# Patient Record
Sex: Female | Born: 1960 | Race: White | Hispanic: No | Smoking: Never smoker
Health system: Southern US, Community
[De-identification: ages and names within clinical notes are randomized; demographics above are authoritative.]

## PROBLEM LIST (undated history)

## (undated) DIAGNOSIS — N891 Moderate vaginal dysplasia: Secondary | ICD-10-CM

## (undated) DIAGNOSIS — F419 Anxiety disorder, unspecified: Secondary | ICD-10-CM

## (undated) DIAGNOSIS — G4700 Insomnia, unspecified: Secondary | ICD-10-CM

## (undated) DIAGNOSIS — N890 Mild vaginal dysplasia: Secondary | ICD-10-CM

## (undated) DIAGNOSIS — M722 Plantar fascial fibromatosis: Secondary | ICD-10-CM

## (undated) DIAGNOSIS — Z9989 Dependence on other enabling machines and devices: Secondary | ICD-10-CM

## (undated) DIAGNOSIS — F3281 Premenstrual dysphoric disorder: Secondary | ICD-10-CM

## (undated) DIAGNOSIS — R200 Anesthesia of skin: Secondary | ICD-10-CM

## (undated) DIAGNOSIS — H269 Unspecified cataract: Secondary | ICD-10-CM

## (undated) DIAGNOSIS — IMO0002 Reserved for concepts with insufficient information to code with codable children: Secondary | ICD-10-CM

## (undated) DIAGNOSIS — K219 Gastro-esophageal reflux disease without esophagitis: Secondary | ICD-10-CM

## (undated) DIAGNOSIS — G4733 Obstructive sleep apnea (adult) (pediatric): Secondary | ICD-10-CM

## (undated) DIAGNOSIS — Z9889 Other specified postprocedural states: Secondary | ICD-10-CM

## (undated) DIAGNOSIS — Z5189 Encounter for other specified aftercare: Secondary | ICD-10-CM

## (undated) DIAGNOSIS — G629 Polyneuropathy, unspecified: Secondary | ICD-10-CM

## (undated) DIAGNOSIS — T7840XA Allergy, unspecified, initial encounter: Secondary | ICD-10-CM

## (undated) DIAGNOSIS — I10 Essential (primary) hypertension: Secondary | ICD-10-CM

## (undated) DIAGNOSIS — C539 Malignant neoplasm of cervix uteri, unspecified: Secondary | ICD-10-CM

## (undated) HISTORY — DX: Mild vaginal dysplasia: N89.0

## (undated) HISTORY — PX: CERVICAL BIOPSY  W/ LOOP ELECTRODE EXCISION: SUR135

## (undated) HISTORY — PX: CATARACT EXTRACTION W/ INTRAOCULAR LENS  IMPLANT, BILATERAL: SHX1307

## (undated) HISTORY — PX: EYE SURGERY: SHX253

## (undated) HISTORY — DX: Insomnia, unspecified: G47.00

## (undated) HISTORY — DX: Other specified postprocedural states: Z98.890

## (undated) HISTORY — DX: Premenstrual dysphoric disorder: F32.81

## (undated) HISTORY — DX: Plantar fascial fibromatosis: M72.2

## (undated) HISTORY — DX: Unspecified cataract: H26.9

## (undated) HISTORY — DX: Encounter for other specified aftercare: Z51.89

## (undated) HISTORY — DX: Reserved for concepts with insufficient information to code with codable children: IMO0002

## (undated) HISTORY — DX: Moderate vaginal dysplasia: N89.1

## (undated) HISTORY — PX: SPINE SURGERY: SHX786

## (undated) HISTORY — DX: Anxiety disorder, unspecified: F41.9

## (undated) HISTORY — DX: Allergy, unspecified, initial encounter: T78.40XA

## (undated) HISTORY — DX: Essential (primary) hypertension: I10

---
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
## 1982-07-08 HISTORY — PX: OTHER SURGICAL HISTORY: SHX169

## 1986-07-08 HISTORY — PX: OTHER SURGICAL HISTORY: SHX169

## 1997-07-08 HISTORY — PX: LUMBAR DISC SURGERY: SHX700

## 1998-01-02 ENCOUNTER — Other Ambulatory Visit: Admission: RE | Admit: 1998-01-02 | Discharge: 1998-01-02 | Payer: Self-pay | Admitting: Gynecology

## 1998-06-22 ENCOUNTER — Observation Stay (HOSPITAL_COMMUNITY): Admission: RE | Admit: 1998-06-22 | Discharge: 1998-06-23 | Payer: Self-pay | Admitting: Neurosurgery

## 1998-06-22 ENCOUNTER — Encounter: Payer: Self-pay | Admitting: Neurosurgery

## 1999-02-22 ENCOUNTER — Other Ambulatory Visit: Admission: RE | Admit: 1999-02-22 | Discharge: 1999-02-22 | Payer: Self-pay | Admitting: Gynecology

## 2000-07-15 ENCOUNTER — Encounter
Admission: RE | Admit: 2000-07-15 | Discharge: 2000-07-15 | Payer: Self-pay | Admitting: Physical Medicine and Rehabilitation

## 2000-07-15 ENCOUNTER — Encounter: Payer: Self-pay | Admitting: Physical Medicine and Rehabilitation

## 2000-08-07 ENCOUNTER — Other Ambulatory Visit: Admission: RE | Admit: 2000-08-07 | Discharge: 2000-08-07 | Payer: Self-pay | Admitting: Gynecology

## 2002-04-12 ENCOUNTER — Other Ambulatory Visit: Admission: RE | Admit: 2002-04-12 | Discharge: 2002-04-12 | Payer: Self-pay | Admitting: Gynecology

## 2003-07-09 HISTORY — PX: VAGINAL HYSTERECTOMY: SUR661

## 2003-07-09 HISTORY — PX: HYSTEROSCOPY WITH D & C: SHX1775

## 2003-07-14 ENCOUNTER — Other Ambulatory Visit: Admission: RE | Admit: 2003-07-14 | Discharge: 2003-07-14 | Payer: Self-pay | Admitting: Gynecology

## 2003-07-26 ENCOUNTER — Encounter (INDEPENDENT_AMBULATORY_CARE_PROVIDER_SITE_OTHER): Payer: Self-pay | Admitting: Specialist

## 2003-07-26 ENCOUNTER — Ambulatory Visit (HOSPITAL_COMMUNITY): Admission: RE | Admit: 2003-07-26 | Discharge: 2003-07-26 | Payer: Self-pay | Admitting: Gynecology

## 2003-07-26 ENCOUNTER — Ambulatory Visit (HOSPITAL_BASED_OUTPATIENT_CLINIC_OR_DEPARTMENT_OTHER): Admission: RE | Admit: 2003-07-26 | Discharge: 2003-07-26 | Payer: Self-pay | Admitting: Gynecology

## 2004-05-24 ENCOUNTER — Encounter (INDEPENDENT_AMBULATORY_CARE_PROVIDER_SITE_OTHER): Payer: Self-pay | Admitting: *Deleted

## 2004-05-25 ENCOUNTER — Inpatient Hospital Stay (HOSPITAL_COMMUNITY): Admission: RE | Admit: 2004-05-25 | Discharge: 2004-05-26 | Payer: Self-pay | Admitting: Gynecology

## 2005-06-26 ENCOUNTER — Other Ambulatory Visit: Admission: RE | Admit: 2005-06-26 | Discharge: 2005-06-26 | Payer: Self-pay | Admitting: Gynecology

## 2007-04-07 ENCOUNTER — Ambulatory Visit: Payer: Self-pay | Admitting: Cardiology

## 2007-04-08 ENCOUNTER — Ambulatory Visit: Payer: Self-pay

## 2007-04-08 ENCOUNTER — Encounter: Payer: Self-pay | Admitting: Cardiology

## 2007-12-08 ENCOUNTER — Other Ambulatory Visit: Admission: RE | Admit: 2007-12-08 | Discharge: 2007-12-08 | Payer: Self-pay | Admitting: Gynecology

## 2008-07-08 HISTORY — PX: GANGLION CYST EXCISION: SHX1691

## 2008-07-27 ENCOUNTER — Ambulatory Visit (HOSPITAL_BASED_OUTPATIENT_CLINIC_OR_DEPARTMENT_OTHER): Admission: RE | Admit: 2008-07-27 | Discharge: 2008-07-27 | Payer: Self-pay | Admitting: Orthopedic Surgery

## 2008-07-27 ENCOUNTER — Encounter (INDEPENDENT_AMBULATORY_CARE_PROVIDER_SITE_OTHER): Payer: Self-pay | Admitting: Orthopedic Surgery

## 2008-11-05 HISTORY — PX: KNEE ARTHROSCOPY W/ MENISCECTOMY: SHX1879

## 2008-11-28 ENCOUNTER — Ambulatory Visit (HOSPITAL_BASED_OUTPATIENT_CLINIC_OR_DEPARTMENT_OTHER): Admission: RE | Admit: 2008-11-28 | Discharge: 2008-11-28 | Payer: Self-pay | Admitting: Orthopedic Surgery

## 2009-01-20 ENCOUNTER — Other Ambulatory Visit: Admission: RE | Admit: 2009-01-20 | Discharge: 2009-01-20 | Payer: Self-pay | Admitting: Gynecology

## 2009-01-20 ENCOUNTER — Ambulatory Visit: Payer: Self-pay | Admitting: Gynecology

## 2009-01-20 ENCOUNTER — Encounter: Payer: Self-pay | Admitting: Gynecology

## 2009-01-27 ENCOUNTER — Ambulatory Visit: Payer: Self-pay | Admitting: Gynecology

## 2009-05-01 ENCOUNTER — Ambulatory Visit (HOSPITAL_COMMUNITY): Admission: RE | Admit: 2009-05-01 | Discharge: 2009-05-01 | Payer: Self-pay | Admitting: Family Medicine

## 2010-03-15 ENCOUNTER — Other Ambulatory Visit: Admission: RE | Admit: 2010-03-15 | Discharge: 2010-03-15 | Payer: Self-pay | Admitting: Gynecology

## 2010-03-15 ENCOUNTER — Ambulatory Visit: Payer: Self-pay | Admitting: Gynecology

## 2010-04-16 ENCOUNTER — Ambulatory Visit: Payer: Self-pay | Admitting: Gynecology

## 2010-04-16 DIAGNOSIS — N890 Mild vaginal dysplasia: Secondary | ICD-10-CM

## 2010-04-16 HISTORY — DX: Mild vaginal dysplasia: N89.0

## 2010-04-24 ENCOUNTER — Ambulatory Visit: Payer: Self-pay | Admitting: Gynecology

## 2010-04-26 ENCOUNTER — Ambulatory Visit (HOSPITAL_BASED_OUTPATIENT_CLINIC_OR_DEPARTMENT_OTHER): Admission: RE | Admit: 2010-04-26 | Discharge: 2010-04-26 | Payer: Self-pay | Admitting: Gynecology

## 2010-04-26 ENCOUNTER — Ambulatory Visit: Payer: Self-pay | Admitting: Gynecology

## 2010-04-27 HISTORY — PX: CO2 LASER OF LEUKOPLAKIA: SHX1364

## 2010-05-08 ENCOUNTER — Ambulatory Visit: Payer: Self-pay | Admitting: Gynecology

## 2010-06-05 ENCOUNTER — Ambulatory Visit: Payer: Self-pay | Admitting: Gynecology

## 2010-07-29 ENCOUNTER — Encounter: Payer: Self-pay | Admitting: Family Medicine

## 2010-10-16 LAB — POCT I-STAT, CHEM 8
BUN: 14 mg/dL (ref 6–23)
Calcium, Ion: 1.15 mmol/L (ref 1.12–1.32)
Chloride: 102 mEq/L (ref 96–112)
Creatinine, Ser: 0.7 mg/dL (ref 0.4–1.2)
Glucose, Bld: 87 mg/dL (ref 70–99)
HCT: 43 % (ref 36.0–46.0)
Hemoglobin: 14.6 g/dL (ref 12.0–15.0)
Potassium: 3.8 mEq/L (ref 3.5–5.1)
Sodium: 138 mEq/L (ref 135–145)
TCO2: 28 mmol/L (ref 0–100)

## 2010-10-22 LAB — BASIC METABOLIC PANEL
CO2: 27 mEq/L (ref 19–32)
Glucose, Bld: 108 mg/dL — ABNORMAL HIGH (ref 70–99)
Potassium: 4.1 mEq/L (ref 3.5–5.1)
Sodium: 138 mEq/L (ref 135–145)

## 2010-11-20 NOTE — Op Note (Signed)
[name], [name]            ACCOUNT NO.:  [id number]   MEDICAL RECORD NO.:  [id number]          PATIENT TYPE:  AMB   LOCATION:  DSC                          FACILITY:  [facility]   PHYSICIAN:  Robert A. Thurston Hole, M.D. DATE OF BIRTH:  [birth year]   DATE OF PROCEDURE:  11/28/2008  DATE OF DISCHARGE:                               OPERATIVE REPORT   PREOPERATIVE DIAGNOSES:  1. Right knee lateral meniscus tear.  2. Right knee patellofemoral chondromalacia.  3. Right knee lateral patellar subluxation with synovitis.   POSTOPERATIVE DIAGNOSES:  1. Right knee lateral meniscus tear.  2. Right knee patellofemoral chondromalacia.  3. Right knee lateral patellar subluxation with synovitis.   PROCEDURES:  1. Right knee examination under anesthesia followed by arthroscopic      partial lateral meniscectomy.  2. Right knee patellofemoral chondroplasty.  3. Right knee lateral retinacular release.  4. Right knee partial synovectomy.   SURGEON:  Elana Alm. Thurston Hole, MD   ASSISTANT:  Julien Girt, PA   ANESTHESIA:  General.   OPERATIVE TIME:  30 minutes.   COMPLICATIONS:  None.   INDICATION FOR PROCEDURE:  [name] is a 48-year woman who has had  significant right knee pain increasing in nature over the past 8-12  months with exam and MRI documenting chondromalacia, synovitis with mild  patellofemoral subluxation.  She has failed conservative care and was  now to undergo arthroscopy.   DESCRIPTION:  [name] was brought to the operating room on Nov 28, 2008, after a knee block was placed in the holding area by Anesthesia.  She was placed on operative table in supine position.  Her right knee  was examined under anesthesia.  She had a full range of motion, knee was  stable on ligamentous exam with slight lateral patellar tracking.  The  right leg was prepped using sterile DuraPrep and draped using sterile  technique.  Originally, through an anterolateral portal, the  arthroscope  with a pump attached was placed and through an anteromedial portal, an  arthroscopic probe was placed.  On initial inspection of the medial  compartment, the articular cartilage showed grade 1 and 2  chondromalacia.  Medial meniscus was intact.  Intercondylar notch was  inspected.  The anterior and posterior cruciate ligaments were normal.  Lateral compartment was inspected.  The articular cartilage, she had a  20% grade 3 chondromalacia, which was debrided, otherwise grade 1 and 2  changes.  Lateral meniscus, she had a partial tear of 20% posterolateral  corner which was resected back to stable rim.  Patellofemoral joint, she  had a 30% grade 3 chondromalacia on the patella, which was debrided.  A  30% grade 3 chondromalacia of the medial femoral condyle contacting the  medial patellar facet at 40-50 degrees of flexion.  This was debrided as  well.  Slight lateral patellar tracking was noted as well.  Moderate  synovitis of medial and lateral gutters were debrided.  A lateral  retinacular release was carried out using an ArthroCare wand with no  excessive bleeding.  This significantly decompressed the patellofemoral  joint and improved patellar tracking,  normal.  At this point, it was  felt that all pathology have been satisfactorily addressed.  The  instruments were removed.  The portals were closed with 3-0 nylon  suture.  Sterile dressings were applied and the patient awakened and  taken to the recovery room in stable condition.   FOLLOWUP CARE:  [name] will be followed as an outpatient on  Vicodin and Mobic.  She will be seen back in the office in a week for  sutures out and followup.      Robert A. Thurston Hole, M.D.  Electronically Signed     RAW/MEDQ  D:  11/28/2008  T:  11/28/2008  Job:  [id number]

## 2010-11-20 NOTE — Op Note (Signed)
[name], [name]            ACCOUNT NO.:  [id number]   MEDICAL RECORD NO.:  [id number]          PATIENT TYPE:  AMB   LOCATION:  DSC                          FACILITY:  [facility]   PHYSICIAN:  Marlowe Kays, M.D.  DATE OF BIRTH:  December 27, 1960   DATE OF PROCEDURE:  07/27/2008  DATE OF DISCHARGE:                               OPERATIVE REPORT   PREOPERATIVE DIAGNOSIS:  Mass, inner aspect, left ankle.   POSTOPERATIVE DIAGNOSIS:  Mass, inner aspect,  left ankle.   OPERATION:  Excision of mass, inner aspect, left ankle.   SURGEON:  Marlowe Kays, MD   ASSISTANT:  Druscilla Brownie. Idolina Primer, PA-C   ANESTHESIA:  General.   PATHOLOGY AND INDICATIONS FOR PROCEDURE:  She has had a progressively  growing mass in the inner aspect of her left ankle.  She does not know  when it first began.  X-rays have been unremarkable and MRI demonstrated  most certainly a benign tumor, but was unable to be definitely  characterized as to exactly what it was - possibly a synovial or  ganglion cyst, and possibly a hemangiomatous vascular structures going  into it.   PROCEDURE:  Satisfied general anesthesia, pneumatic tourniquet to 350  mmHg, the leg was Esmarch out non-sterilely and the tourniquet inflated,  then prepped her left leg with DuraPrep from midcalf to toes and draped  as a sterile field, made a curved incision centered over the mass once  through the subcutaneous tissue with careful dissection and with Mr.  Idolina Primer assisting in the retraction because of the complexity of the  anatomy here.  I worked my way down around the mass, it was lying on and  superior to the posterior tibial nerve, I dissected it off with a blunt  hemostat.  Number of blood vessels entering into it were coagulated.  I  followed the mass down to the deltoid ligament and dissection there.  I  think there was a small aperture in the deltoid ligament area which I  closed with interrupted 3-0 Vicryl.  Several small bleeders  in this area  were coagulated as well.  I then released the tourniquet.  Minimal  bleeders were coagulated.  The wound was essentially dry on closure with  interrupted 3-0 Vicryl, subcutaneous tissue and interrupted 4-0 nylon  mattress sutures in the skin.  Betadine, Adaptic dry sterile dressing  were applied.  Because of the repair of the deltoid ligament, I placed  her in a posterior plaster splint.  She tolerated the procedure well and  at the time of this dictation, and was on her way to the recovery room  in satisfactory condition with no known complications.           ______________________________  Marlowe Kays, M.D.     JA/MEDQ  D:  07/27/2008  T:  07/28/2008  Job:  [id number]

## 2010-11-20 NOTE — Assessment & Plan Note (Signed)
[facility]                            CARDIOLOGY OFFICE NOTE   [name], [name]                   MRN:          [id number]  DATE:04/07/2007                            DOB:          [birth year]    REFERRING PHYSICIAN:  Jonita Albee, M.D.   RE-DICTATION:   PRIMARY CARE PHYSICIAN:  Jonita Albee, M.D., at [facility] on [address]   REASON FOR REFERRAL:  Evaluation of chest pain.   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  Over the last week, she has developed substernal chest  pressure which has been mostly persistent since that time, although it  has varied in intensity.  She has also had some associated shortness of  breath and diaphoresis with exertion when she hurries through the  airport.  She does not notice any relationship of the pressure to  exertion, and she does not notice any relationship to change in movement  or breathing.  She has had no associated symptoms of palpitations.   PAST MEDICAL HISTORY:  1. Previous lumbar disc surgery.  2. Previous surgery for cervical cancer.  3. Multiple knee surgeries.  4. She has also had multiple minor operations.  5. Hypertension.  6. Hyperlipidemia.   CURRENT MEDICATIONS:  Celexa, alprazolam, Diovan/hydrochlorothiazide,  and Provigil.   FAMILY HISTORY:  Positive in that her mother had an increased heart rate  and is seen by Dr. Daleen Squibb.  Her father has had hypertension.  She has a  brother who has no known heart disease.   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  Her job is quite stressful.  She  has three daughters, and she has a husband who is a Teacher, early years/pre.  Her  daughters are in junior high and high school and  are quite good  students, and one or two of them play volleyball.   REVIEW OF SYSTEMS:  Positive for some symptoms of reflux.   PHYSICAL EXAMINATION:  VITAL SIGNS:  Blood pressure 134/98, pulse 83 and  regular.  NECK:  There is no venous distention.  The carotid pulses are full,  without bruits.  CHEST:  Clear.  There were no rales or rhonchi.  CARDIAC:  Rhythm was regular.  The first and second heart sounds were  normal.  I could seen no murmurs, gallops, or clicks.  ABDOMEN:  Soft, with normal bowel sounds.  There was no  hepatosplenomegaly.  EXTREMITIES:  The peripheral pulses were full.  There was no peripheral  edema.  MUSCULOSKELETAL:  Showed no deformities.  SKIN:  Warm and dry.  NEUROLOGIC:  Showed no focal neurological signs.   Electrocardiogram was normal.   IMPRESSION:  1. Chest pain of uncertain etiology.  2. Hypertension.  3. Hyperlipidemia.   RECOMMENDATIONS:  [name] chest pain is somewhat atypical for  ischemia in that it  is not related to exertion, but she does have a  significant risk profile with hypertension, hyperlipidemia, and excess  weight.  I think she should be evaluated for ischemic heart disease, and  I discussed various options with her, including a stress Myoview scan, a  CT angiogram, and a cardiac catheterization.  After some discussion, we  decided to proceed with a stress Myoview scan.  She realizes that there  is a possibility that this could give [place] a false reading because of her  weight.  If her stress Myoview scan is positive, then she may need  cardiac catheterization.  If the stress Myoview scan is negative, then I  think she could have a trial of PPI with followup with Dr. Perrin Maltese.  I  will be in touch with her by phone after we have the results.  We will  also arrange for her to have an echocardiogram to rule out structural  heart disease.   ADDENDUM:  Chest x-ray in Dr. Ernestene Mention office showed borderline heart  size and clear  lung fields.     Bruce Elvera Lennox Juanda Chance, MD, [facility]  Electronically Signed    BRB/MedQ  DD: 04/07/2007  DT: 04/08/2007  Job #: [id number]

## 2010-11-20 NOTE — Assessment & Plan Note (Signed)
[facility]                            CARDIOLOGY OFFICE NOTE   GUY, TONEY                   MRN:          [id number]  DATE:04/07/2007                            DOB:          [birth year]    REFERRING PHYSICIAN:  Jonita Albee, M.D., [facility] on [address].   PRIMARY CARE PHYSICIAN:  Jonita Albee, M.D.   REASON FOR REFERRAL:  Evaluation of chest pain.   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  Over the last week, she has developed substernal chest  pressure which has been persistent over this time period with increasing  and decreasing in severity.  She has had no associated palpitations or  change in shortness of breath.  She has not been able to identify any  features that aggravate this, specifically, it does not change with  position.  She does say that when she exerts herself which she does when  she is going to airports, for example, she does get sweaty and does get  somewhat short of breath, although this has not changed.   PAST MEDICAL HISTORY:  1. Hypertension.  2. Hyperlipidemia.  She said that her total cholesterol is 253, HDL 71      and LDL 130 and she has discussed with Dr. Perrin Maltese about treatment      and decided to try and treat this with diet alone.  3. Previous knee surgeries.  4. Previous lumbar disk surgery.  5. Previous surgery for cervical cancer.  6. She has had multiple other minor operations.   CURRENT MEDICATIONS:  1. Celexa.  2. Alprazolam.  3. Diovan/hydrochlorothiazide.  4. Provigil.   FAMILY HISTORY:  Her mother has had elevated heart rate and sees Dr. Juanito Doom.  Her father has had hypertension.  There is no history of heart  attacks in the family.   SOCIAL HISTORY:  She is married and has three daughters.  Her daughters  are about 83, 21 and 33 and are quite  good students and volleyball  players.  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  She has quite a bit of stress  involved in her work.   REVIEW OF SYSTEMS:  Positive for intermittent symptoms of reflux.  She  has not taken PPIs but she has taken antacids.   PHYSICAL EXAMINATION:  VITAL SIGNS:  Blood pressure 134/98, pulse 83 and  regular.  NECK:  There was no venous distension.  The carotid pulses were full  without bruits.  CHEST:  Clear.  There were no rales or rhonchi.  CARDIOVASCULAR:  The cardiac rhythm was regular.  The first and second  heart sounds were normal.  No murmurs or gallops.  ABDOMEN:  Soft without hepatosplenomegaly.  EXTREMITIES:  Peripheral pulses were full.  There was no peripheral  edema.  MUSCULOSKELETAL:  No deformities.  SKIN:  Warm and dry.  NEUROLOGIC:  No focal neurological signs.   Electrocardiogram was normal.   IMPRESSION:  1. Chest pain of uncertain etiology.  2. Hypertension.  3. Hyperlipidemia.   RECOMMENDATIONS:  The patient's symptoms are somewhat atypical for  ischemia in that they have been not typically related to exertion.  She  does have a moderately high risk profile with hypertension,  hyperlipidemia and excess weight.  I think she needs to be evaluated  further for the possibility of ischemic heart disease and discussed  several possibilities with her including stress Myoview scan, CT angio  and cardiac catheterization.  The noninvasive tests have a limitation  that her weight may make interpretation somewhat difficult.  Despite  these limitations, we decided to go ahead and proceed with noninvasive  testing with an exercise rest stress Myoview scan.  Also get an  echocardiogram  to rule out any myopathic disease and valvular disease,  although I do not detect any valvular disease on examination.  I  will be  in touch with her by phone after we have the results.  If the scan is  negative, then I think a trial of PPIs with follow-up with Dr. Perrin Maltese  would be indicated.  The other alternative is that this still could be  musculoskeletal pain.  Will be in touch with her after I have the  results of her tests.     Bruce Elvera Lennox Juanda Chance, MD, [facility]  Electronically Signed    BRB/MedQ  DD: 04/07/2007  DT: 04/08/2007  Job #: [id number]

## 2010-11-23 NOTE — Discharge Summary (Signed)
[name], [name]            ACCOUNT NO.:  [id number]   MEDICAL RECORD NO.:  [id number]          PATIENT TYPE:  INP   LOCATION:  9303                          FACILITY:  [facility]   PHYSICIAN:  Juan H. Lily Peer, M.D.DATE OF BIRTH:  11/15/1960   DATE OF ADMISSION:  05/24/2004  DATE OF DISCHARGE:  05/26/2004                                 DISCHARGE SUMMARY   HISTORY:  The patient is a 50 year old gravida 3, para 3 who on the morning  of May 24, 2004 underwent a transvaginal hysterectomy, secondary to  menometrorrhagia.  She had 200 cc blood loss during the procedure, and had  2100 cc of lactated Ringer's and received 1 g of Cefotan for prophylaxis.   HOSPITAL COURSE:  On postoperative day #1 she was doing well.  Her  hemoglobin was 12.1, hematocrit 34.9%, platelet count 309,000.  She was  afebrile.  Her Foley catheter was removed.  She was voiding well.  She was  starting to pass flatus later that evening.  She was started on Percocet and  had tolerated her liquid diet.  On the following day she was tolerating her  full regular diet.  She was afebrile and was discharged home on Percocet  7.5/500 mg one p.o. q.4-6h. p.r.n. pain.   DISCHARGE INSTRUCTIONS:  She was to follow up in three weeks for a  postoperative visit.   FINAL DIAGNOSIS:  Menometrorrhagia.   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  No __________ lesion.  Endometrium with markedly decidualized endometrial  stroma, with inactive endometrial plants -- suggestive of exogenous  progesterone therapy.  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   FINAL DISPOSITION AND FOLLOWUP:  The patient was discharged home on her  second postoperative day, ambulating and voiding well.  Tolerating regular  diet well.   DISCHARGE INSTRUCTIONS:  No lifting.  No strenuous activity.   DISCHARGE MEDICATIONS:  Prescription for Percocet 7.5/500 mg to take one  p.o. q.4-6h. p.r.n. pain.   FOLLOWUP:  GYN office at [facility] with Dr. Lily Peer in three  weeks.     Juan   JHF/MEDQ  D:  06/18/2004  T:  06/19/2004  Job:  [id number]

## 2010-11-23 NOTE — Op Note (Signed)
NAMETEMPIE, GIBEAULT            ACCOUNT NO.:  [id number]   MEDICAL RECORD NO.:  [id number]          PATIENT TYPE:  OBV   LOCATION:  9399                          FACILITY:  [facility]   PHYSICIAN:  Juan H. Lily Peer, M.D.DATE OF BIRTH:  [birth year]   DATE OF PROCEDURE:  05/24/2004  DATE OF DISCHARGE:                                 OPERATIVE REPORT   INDICATIONS FOR PROCEDURE:  A 50 year old, gravida 3, para 3 with chronic  history of menometrorrhagia with several endometrial biopsies in the past  and sonohysterogram. The patient had been on different hormone replacement  therapies and eventually was placed on Megace to stop her bleeding to get  her iron level up prior to her surgery.  Her preoperative hemoglobin and  hematocrit of 14.6 and 42.3 respectively with platelets 372,000.   PREOPERATIVE DIAGNOSES:  Menometrorrhagia.   POSTOPERATIVE DIAGNOSES:  Menometrorrhagia.   ANESTHESIA:  Spinal.   SURGEON:  Juan H. Lily Peer, M.D.   FIRST ASSISTANT:  Timothy P. Fontaine, M.D.   PROCEDURE:  Transvaginal hysterectomy.   FINDINGS:  Upper limits of normal uterus, boggy, very lush endometrium,  normal tubes and ovary.   DESCRIPTION OF PROCEDURE:  After the patient was adequately counseled, she  was taken to the operating room where she had undergone spinal anesthesia.  She was placed in high lithotomy position, the abdomen, vagina and perineum  were prepped and draped in the usual sterile fashion. A Foley catheter was  placed in an effort to monitor urinary output.  A short weighted bill  speculum was placed in the posterior vaginal vault, the anterior and  posterior cervical lip were respectively grasped with a __________ clamp.  The cervicovaginal fold was identified and a circumferential incision was  made and the fascia was separated from the mucosa in a circumferential  fashion. The cul-de-sac was entered and a long weighted bill speculum was  introduced into the posterior  cul-de-sac with the use of the gyrus forceps  electrical unit. The uterosacral ligaments were coagulated and cut and  suture ligated with #0 Vicryl suture and tagged. The remainder of the broad  cardinal ligaments were done in a similar fashion bilaterally and both  triple pedicles were identified and they were cross clamped with a Heaney  clamp, cut and individually tied with #0 Vicryl suture followed by  transfixation stitch of #0 Vicryl suture. The ovaries were inspected,  appeared normal size and shape and the vaginal vault was irrigated with  normal saline solution.  Some oozing that was noted at the vaginal cuff was  secured with a running locking stitch of #0 Vicryl suture in a baseball  fashion.  A McCall culdoplasty was performed using 2-0 Vicryl suture from  the posterior cul-de-sac __________ the peritoneum and taking a bite out of  both uterosacral ligaments and secured at the 6 o'clock position.  The  vaginal vault was then closed with interrupted sutures of #0 Vicryl suture.  The McCall stitch was secured, the vaginal vault was copiously  irrigated with normal saline solution. The vagina was packed with an  iodoform gauze. The patient was transferred to  the recovery room with stable  vital signs.  Blood loss was 200 mL, IV fluid was 2100 mL of lactated  Ringer's.  Urine output 200 mL and clear. She received a gram of Cefotan  prophylactically preop.     Juan   JHF/MEDQ  D:  05/24/2004  T:  05/25/2004  Job:  [id number]

## 2010-11-23 NOTE — H&P (Signed)
[name], [name]            ACCOUNT NO.:  [id number]   MEDICAL RECORD NO.:  [id number]           PATIENT TYPE:   LOCATION:                                 FACILITY:   PHYSICIAN:  Juan H. Lily Peer, M.D.     DATE OF BIRTH:   DATE OF ADMISSION:  05/24/2004  DATE OF DISCHARGE:                                HISTORY & PHYSICAL   CHIEF COMPLAINT:  Menometrorrhagia.   HISTORY:  The patient is a 50 year old, gravida 3, para 3 (normal  spontaneous vaginal delivery), who has had a long-standing history of  dysfunctional uterine bleeding.  The patient has had several endometrial  biopsies in the past.  She had been placed on Megace in the past, as well,  in an effort to control her bleeding.  She had been tried also on different  hormone regimens, and continued to have dysfunctional uterine bleeding.  When she was seen in the office on March 12, 2004, she had been bleeding  for almost a 3-week period consecutively.  She had an endometrial biopsy in  January of 2005, which was normal.  A repeat endometrial biopsy in September  of 2005 demonstrated a proliferative endometrium with breakdown order of  proliferative pattern.  No hyperplasia or malignancy noted.  The patient  wanted to proceed with definitive surgery such as a hysterectomy, and had to  go through an endometrial ablation since she is otherwise and concerned  about her risks for endometrial hyperplasia or endometrial carcinoma.  We  underwent a detailed discussion of the risks and benefits and pros and cons  of hysterectomy.  On examination, her uterus descended well.  There was no  evidence of cystocele, rectocele and will plan to examine under anesthesia  to determine if the vaginal route would be beneficial, versus proceeding  with an abdominal approach.   PAST MEDICAL HISTORY:  1.  The patient has had 3 normal spontaneous vaginal deliveries.  She had      anti-E antibodies prenatally.  2.  She suffers from  premenstrual dysphoric disorder.  3.  She suffers also from plantar fasciitis.  4.  She also has osteopenia, for which she is on Fosamax 70 mg daily,      calcium 1500 mg daily, along with 800 international units of vitamin D.      She takes vitamin E 400 units daily, as well, and Claritin D on a p.r.n.      basis.  5.  She has also had cataract surgery in the past.  6.  The patient also had a diagnostic hysteroscopy and endometrial curettage      in January of 2005, whereby that pathology report demonstrated polypoid      fragments of the _smooth_________ muscle, and no evidence of      hyperplasia.  Mammogram earlier this year looked normal.   FAMILY HISTORY:  Grandparents with diabetes.  Father with a history of  hypertension.  Grandmother with a history of colon cancer.  Grandmother with  breast cancer.  Mother with breast cancer history.   ALLERGIES:  CODEINE.   PHYSICAL EXAMINATION:  VITAL SIGNS:  The patient weighs 238 pounds.  Height  5 feet, and 5-1/4 inches.  Blood pressure 130/80.  HEENT:  Unremarkable.  NECK:  Supple.  Trachea midline.  No carotid bruits, no thyromegaly.  LUNGS:  Clear to auscultation without any rhonchi or wheezes.  HEART:  Regular rate and rhythm.  No murmurs or gallops.  BREAST EXAM:  The breast exam done at the time of her annual exam in January  year had been reportedly normal.  ABDOMEN:  Soft, nontender.  No rebound or guarding.  PELVIC:  Bartholin's, urethra, Skene's glands within normal limits.  Vaginal  and cervix revealed no gross lesions on inspection.  Uterus anteverted and  upper limits of normal.  Adnexa no masses or tenderness.  RECTAL:  Not done.   ASSESSMENT:  A 51 year old, gravida 3, para 3, with persistent of  menometrorrhagia, currently on Megace 20 mg b.i.d. to stop her bleeding.  The patient with several endometrial biopsies in the past.  D&C hysteroscopy  with continued symptoms.  She had been offered endometrial ablation, and   decided to proceed with definitive treatment, such as a hysterectomy.  The  risks, benefits, and pros and cons of hysterectomy were discussed.  Will  plan on a transvaginal approach.  Initially will examine under anesthesia.  If it does not appear that her uterus descends well because of her size, we  may need to proceed with an abdominal approach.  She will be counseled for  both procedures, in the event that while she is asleep we may need to go  through the abdominal route, or, if we cannot finish it in the vaginal route  and had to complete it abdominally.  We will make every effort to leave both  ovaries, unless surgically indicated or any unusual findings at the time of  the surgery.  Risks of the operation to include infection (although she will  receive prophylactic antibiotics) were discussed.  The risks for deep vein  thrombosis and pulmonary embolism were discussed also to include even death.  The patient will have PSA stockings for DVT prophylaxis.  In the even of  uncontrolled hemorrhage, she is fully aware that she may need a blood  transfusion, and that carries some risks, such as anaphylactic reaction,  hepatitis, and AIDS from donor blood and blood products.  The patient is  fully aware that she will not be able to have anymore children as a result  of this operation, and, in the remote possibility of both ovaries needing to  be removed, that she would need to be placed on hormone replacement therapy  for the remainder of her life.  We also discussed the potential risk of  complications from her surgery to include trauma to nearby structures, the  bladder, the rectum, requiring corrective surgery at that point.  All these  issues were discussed with the patient, all questions were answered, and  will follow accordingly.   PLAN:  The patient is scheduled for total vaginal hysterectomy, possible total abdominal hysterectomy, on Thursday, May 24, 2004 at 7:30 a.m. at   [facility].     [place]   JHF/MEDQ  D:  05/18/2004  T:  05/18/2004  Job:  [id number]

## 2010-11-23 NOTE — Op Note (Signed)
NAME:  [name], [name]                      ACCOUNT NO.:  [id number]   MEDICAL RECORD NO.:  [id number]                   PATIENT TYPE:  AMB   LOCATION:  [facility]                                 FACILITY:  [facility]   PHYSICIAN:  Juan H. Lily Peer, M.D.             DATE OF BIRTH:  Jul 28, 1960   DATE OF PROCEDURE:  07/26/2003  DATE OF DISCHARGE:                                 OPERATIVE REPORT   INDICATIONS FOR PROCEDURE:  The patient is a 50 year old Gravida III, Para 3  with a history of menometrorrhagia.   PREOPERATIVE DIAGNOSIS:  Menometrorrhagia.   POSTOPERATIVE DIAGNOSIS:  Menometrorrhagia.   SURGEON:  Juan H. Lily Peer, M.D.   ANESTHESIA:  General endotracheal anesthesia.   PROCEDURE:  Diagnostic hysteroscopy with endometrial biopsy/curettage.   FINDINGS:  Multiple intrauterine polyps were seen throughout the cavity.  Both tubal ostia were identified and the cervical canal was visualized and  smooth.   DESCRIPTION OF PROCEDURE:  After the patient was adequately counseled, she  was taken to the operating room where she underwent a general endotracheal  anesthesia.  She had been given a gram of Cefotan prophylactically.  She was  placed in the high lithotomy position and the perineum and vagina were  prepped and draped in the usual sterile fashion.  A red rubber Roxan Hockey was  inserted to empty the bladder contents although she had just recently voided  before being taken to the operating room.  Her examination under anesthesia  demonstrated an anteverted uterus with normal size, shape and consistency  without any palpable adnexal masses.  A previously placed Laminaria was  removed requiring minimal cervical dilatation.  The anterior cervical lip  was grasped with the single tooth tenaculum and the uterus sounded to  approximately 8 cm.  The Lanice Shirts operative resectoscope with the 90  degree wire loop was inserted into the intrauterine cavity.  The distending  medium was 3%  sorbitol.  The [facility] cervical generator was  set at 70 watts on the cutting mode and 100 on the coagulation mode.  Intrauterine pressure was kept at 90 to 100 mmHg.  Inspection of the entire  uterine cavity demonstrated multiple vascular polyps scattered throughout  the endometrial cavity.  Both tubal ostia were identified.  The endocervical  canal was smooth.  These polyps were independently resected and will be sent  for histological evaluation.  Following this a vigorous curettage with a  blunt curette was utilized followed by a suction curette with a 10 mm  suction curette to completely evacuate the contents.  Re-inspection of the  endometrial cavity demonstrated removal of all the polyps and good  hemostasis.  The tenaculum was removed.  The patient was extubated.  She was  given 30 mg of Toradol en route to the recovery room.  Vital signs were  stable.  Blood loss was minimal and fluid resuscitation consisted of a liter  of lactated Ringers.  Fluid deficit from the 3% sorbitol distending medium  was recorded as 85 mL.                                              Juan H. Lily Peer, M.D.   JHF/MEDQ  D:  07/26/2003  T:  07/26/2003  Job:  [id number]

## 2010-11-23 NOTE — H&P (Signed)
NAME:  [name], [name]                      ACCOUNT NO.:  [id number]   MEDICAL RECORD NO.:  [id number]                   PATIENT TYPE:  AMB   LOCATION:  [facility]                                 FACILITY:  [facility]   PHYSICIAN:  Juan H. Lily Peer, M.D.             DATE OF BIRTH:  04/07/1961   DATE OF ADMISSION:  DATE OF DISCHARGE:                                HISTORY & PHYSICAL   CHIEF COMPLAINT:  Menometrorrhagia.   HISTORY OF PRESENT ILLNESS:  The patient is a 50 year old gravida 3, para 3  with a history of menometrorrhagia who has been evaluated in the office.  She had an endometrial biopsy and sonohysterogram secondary to the  menometrorrhagia.  She had been placed on Megace 20 mg b.i.d. to shut off  her bleeding.  Her TSH and Prolactin have been normal.  Her endometrial  biopsy on May 06, 2003, demonstrated a disorder of proliferative pattern  with breakdown, no evidence of hyperplasia or malignancy.  The ultrasound  demonstrated a uterus measuring 9.8 x 5.0 x 6.2 cm with an endometrial  stripe of 15.6 mm.  The right and left ovaries were normal.  There was no  fluid in the cul-de-sac.  The saline infusion and sonohysterogram only  demonstrated endometrial thickness especially along the anterior and  posterior wall and slightly irregular.  The plan was to continue her Megace  20 mg b.i.d. and to schedule a hysteroscopic hysteroscopy and D&C.  Once  this was completed we had discussed that she would probably benefit from  placement on a low dose oral contraceptive pill, she is overweight and has a  history of chronic anovulation and provided a TSH and Prolactin was normal,  I am concerned that doing endometrial ablation with a chronic anovulation.  Later on she could develop underlying hyperplasia or some form of uterine  hyperplastic or atypicality.  She would like to proceed with a hysterectomy  as her last resort and her surgery originally was scheduled for last week  but  due to the fact that she had a sinusitis was rescheduled for tomorrow,  July 26, 2003.   ALLERGIES:  The patient is allergic to CODEINE.   PAST MEDICAL HISTORY:  She has had three normal spontaneous vaginal  deliveries.  She has had preeclampsia.  She has had anti-E.  She has had  PMDD and she has had plantar fasciitis.  She has had cataract surgery.   MEDICATIONS:  1. Vitamin E.  2. Cardizem D.   FAMILY HISTORY:  Significant for grandparents with diabetes, parents with  hypertension, grandmother with hypertension, breast cancer in the  grandmother, and mother with breast cancer.   PAST GYNECOLOGICAL HISTORY:  The patient's most recent Pap smear was in  January 2005 which was reported to be normal.   PHYSICAL EXAMINATION:  GENERAL:  The patient is 5 feet 5 inches tall, weight  238 pounds, blood pressure 130/80.  HEENT:  Unremarkable.  NECK:  Supple.  Trachea midline.  No carotid bruits.  No thyromegaly.  LUNGS:  Clear to auscultation.  No rhonchi or wheezes.  HEART:  Regular rate and rhythm; no murmurs or gallops.  BREASTS:  Exam was normal at the time of her annual exam in January of this  year.  ABDOMEN:  Soft, nontender, without rebound or guarding.  PELVIC:  Bartholin's, urethra, Skene glands within normal limits.  Vaginal  and cervix no gross lesions on inspection.  Uterus:  Anteverted, normal  size, shape and consistency.  Adnexa without masses or tenderness.  RECTAL:  Exam is unremarkable.   ASSESSMENT:  This is a 50 year old gravida 3, para 3 with menometrorrhagia,  recent endometrial biopsy benign.  The patient is on Megace 20 mg b.i.d.  The patient is scheduled to undergo hysteroscopy D&C on Tuesday, July 26, 2003.  She was seen in the office on Monday, July 25, 2003, for  preoperative consultation and a Laminaria was placed endocervically to  facilitate the insertion of the hysteroscope tomorrow in the operating room.  The patient had previously been  provided with literature and information on  hysteroscopy.  Its risks, benefits, pros and cons were discussed and include  perforation of the uterus from the instrumentation, hemorrhage requiring  blood transfusion with its potential risks of anaphylactic reaction,  hepatitis, and AIDS from blood transfusion, the risks of deep venous  thrombosis and pulmonary embolism, pulmonary edema from fluid overload from  the distending media in the event of perforation requiring emergency open  laparotomy.  All of these issues were discussed with the patient and all  questions were answered and we follow accordingly.   PLAN:  The patient is scheduled for diagnostic D&C and hysteroscopy  tomorrow, Tuesday, July 26, 2003, at 1 p.m. in [facility].                                               Juan H. Lily Peer, M.D.    JHF/MEDQ  D:  07/25/2003  T:  07/25/2003  Job:  [id number]

## 2011-01-30 HISTORY — PX: ABDOMINOPLASTY: SUR9

## 2011-04-22 DIAGNOSIS — D126 Benign neoplasm of colon, unspecified: Secondary | ICD-10-CM

## 2011-05-08 ENCOUNTER — Encounter: Payer: Self-pay | Admitting: Anesthesiology

## 2011-05-15 ENCOUNTER — Encounter: Payer: Self-pay | Admitting: Gynecology

## 2011-05-15 ENCOUNTER — Other Ambulatory Visit (HOSPITAL_COMMUNITY)
Admission: RE | Admit: 2011-05-15 | Discharge: 2011-05-15 | Disposition: A | Discharge status: Home / Self Care | Payer: 59 | Source: Ambulatory Visit | Attending: Gynecology | Admitting: Gynecology

## 2011-05-15 ENCOUNTER — Ambulatory Visit (INDEPENDENT_AMBULATORY_CARE_PROVIDER_SITE_OTHER): Payer: 59 | Admitting: Gynecology

## 2011-05-15 VITALS — BP 128/90 | Ht 64.75 in | Wt 221.0 lb

## 2011-05-15 DIAGNOSIS — Z01419 Encounter for gynecological examination (general) (routine) without abnormal findings: Secondary | ICD-10-CM

## 2011-05-15 DIAGNOSIS — R634 Abnormal weight loss: Secondary | ICD-10-CM

## 2011-05-15 DIAGNOSIS — N951 Menopausal and female climacteric states: Secondary | ICD-10-CM

## 2011-05-15 DIAGNOSIS — Z1211 Encounter for screening for malignant neoplasm of colon: Secondary | ICD-10-CM

## 2011-05-15 NOTE — Progress Notes (Signed)
[name] 06-29-1961 [id number]   History:    50 y.o.  for annual exam with no complaints today. Review records in her last mammogram was in October 2011. She recently had an abdominoplasty. Review of her record indicates she was weighing 234 down to 21. She has history of hypertension and has been followed by Dr.Guest. Patient with prior history transvaginal hysterectomy. Patient with very vague vasomotor symptoms. Review records indicated 2011 her FSH was at 3. Patient does her monthly self breast examination.  Past medical history,surgical history, family history and social history were all reviewed and documented in the EPIC chart.  Gynecologic History No LMP recorded. Patient has had a hysterectomy. Contraception: Prior hysterectomy Last Pap: 2011. Results were:normal} Last mammogram: 2011. Results were: normal  Obstetric History OB History    Grav Para Term Preterm Abortions TAB SAB Ect Mult Living   3 3 3       3      # Outc Date GA Lbr Len/2nd Wgt Sex Del Anes PTL Lv   1 TRM     F SVD  No Yes   2 TRM     F SVD   Yes   3 TRM     F SVD   Yes       ROS:  Was performed and pertinent positives and negatives are included in the history.  Exam: chaperone present  BP 128/90  Ht 5' 4.75" (1.645 m)  Wt 221 lb (100.245 kg)  BMI 37.06 kg/m2  Body mass index is 37.06 kg/(m^2).  General appearance : Well developed well nourished female. No acute distress HEENT: Neck supple, trachea midline, no carotid bruits, no thyroidmegaly Lungs: Clear to auscultation, no rhonchi or wheezes, or rib retractions  Heart: Regular rate and rhythm, no murmurs or gallops Breast:Examined in sitting and supine position were symmetrical in appearance, no palpable masses or tenderness,  no skin retraction, no nipple inversion, no nipple discharge, no skin discoloration, no axillary or supraclavicular lymphadenopathy Abdomen: no palpable masses or tenderness, no rebound or guarding scar from  previous abdominal last the noted in the lower aspect of her abdomen. Extremities: no edema or skin discoloration or tenderness  Pelvic:  Bartholin, Urethra, Skene Glands: Within normal limits             Vagina: No gross lesions or discharge  Cervix: Absent  Uterus absent,                                                    Adnexa  Without masses or tenderness  Anus and perineum  normal   Rectovaginal  normal sphincter tone without palpated masses or tenderness             Hemoccult done resulting in time of this dictation     Assessment/Plan:  50 y.o. female for annual exam unremarkable. Patient will be scheduling a mammogram which is overdue. She was instructed to continue monthly self breast examination. She was instructed to continue to take her calcium vitamin D for osteoporosis prevention. We'll draw her FSH and TSH today due to her vague vasomotor symptoms. We'll wait her symptoms get worse we tested down discussed treatment of the menopause. Patient will schedule her colonoscopy. Fecal occult blood testing result pending at time of this dictation. We'll plan on doing a bone density study  in the next year.   Ok Edwards MD, 5:59 PM 05/15/2011

## 2011-05-17 ENCOUNTER — Encounter: Payer: Self-pay | Admitting: Gynecology

## 2011-05-22 ENCOUNTER — Other Ambulatory Visit: Payer: Self-pay | Admitting: *Deleted

## 2011-05-22 DIAGNOSIS — B373 Candidiasis of vulva and vagina: Secondary | ICD-10-CM

## 2011-05-22 DIAGNOSIS — B3731 Acute candidiasis of vulva and vagina: Secondary | ICD-10-CM

## 2011-05-22 MED ORDER — FLUCONAZOLE 150 MG PO TABS: 150.0000 mg | ORAL_TABLET | Freq: Once | ORAL | Status: AC

## 2011-08-13 ENCOUNTER — Encounter: Payer: Self-pay | Admitting: Gynecology

## 2011-08-13 ENCOUNTER — Other Ambulatory Visit: Payer: Self-pay | Admitting: Gynecology

## 2011-08-13 ENCOUNTER — Ambulatory Visit (INDEPENDENT_AMBULATORY_CARE_PROVIDER_SITE_OTHER): Payer: 59 | Admitting: Gynecology

## 2011-08-13 VITALS — BP 136/92

## 2011-08-13 DIAGNOSIS — R8290 Unspecified abnormal findings in urine: Secondary | ICD-10-CM

## 2011-08-13 DIAGNOSIS — N760 Acute vaginitis: Secondary | ICD-10-CM

## 2011-08-13 DIAGNOSIS — Z113 Encounter for screening for infections with a predominantly sexual mode of transmission: Secondary | ICD-10-CM

## 2011-08-13 DIAGNOSIS — N9089 Other specified noninflammatory disorders of vulva and perineum: Secondary | ICD-10-CM

## 2011-08-13 DIAGNOSIS — A499 Bacterial infection, unspecified: Secondary | ICD-10-CM

## 2011-08-13 DIAGNOSIS — N390 Urinary tract infection, site not specified: Secondary | ICD-10-CM

## 2011-08-13 DIAGNOSIS — B9689 Other specified bacterial agents as the cause of diseases classified elsewhere: Secondary | ICD-10-CM

## 2011-08-13 DIAGNOSIS — R82998 Other abnormal findings in urine: Secondary | ICD-10-CM

## 2011-08-13 LAB — URINALYSIS W MICROSCOPIC + REFLEX CULTURE
Casts: NONE SEEN
Glucose, UA: NEGATIVE mg/dL
Protein, ur: NEGATIVE mg/dL
pH: 6 (ref 5.0–8.0)

## 2011-08-13 LAB — WET PREP FOR TRICH, YEAST, CLUE: WBC, Wet Prep HPF POC: NONE SEEN

## 2011-08-13 MED ORDER — CLINDAMYCIN PHOSPHATE 2 % VA CREA: 1.0000 | TOPICAL_CREAM | Freq: Every day | VAGINAL | Status: AC

## 2011-08-13 MED ORDER — CALCIPOTRIENE-BETAMETH DIPROP 0.005-0.064 % EX OINT: TOPICAL_OINTMENT | Freq: Every day | CUTANEOUS | Status: DC

## 2011-08-13 MED ORDER — NITROFURANTOIN MONOHYD MACRO 100 MG PO CAPS: 100.0000 mg | ORAL_CAPSULE | Freq: Two times a day (BID) | ORAL | Status: AC

## 2011-08-13 NOTE — Patient Instructions (Addendum)
Cleocin vaginal cream to apply inside vagina before bedtime for 5 days. Macrobid take one PO BID for 7 days. I wrote for refills so you can take one after intercourse. Steroid cream for suspected psoriasis.  Bacterial Vaginosis Bacterial vaginosis (BV) is a vaginal infection where the normal balance of bacteria in the vagina is disrupted. The normal balance is then replaced by an overgrowth of certain bacteria. There are several different kinds of bacteria that can cause BV. BV is the most common vaginal infection in women of childbearing age. CAUSES   The cause of BV is not fully understood. BV develops when there is an increase or imbalance of harmful bacteria.   Some activities or behaviors can upset the normal balance of bacteria in the vagina and put women at increased risk including:   Having a new sex partner or multiple sex partners.   Douching.   Using an intrauterine device (IUD) for contraception.   It is not clear what role sexual activity plays in the development of BV. However, women that have never had sexual intercourse are rarely infected with BV.  Women do not get BV from toilet seats, bedding, swimming pools or from touching objects around them.  SYMPTOMS   Grey vaginal discharge.   A fish-like odor with discharge, especially after sexual intercourse.   Itching or burning of the vagina and vulva.   Burning or pain with urination.   Some women have no signs or symptoms at all.  DIAGNOSIS  Your caregiver must examine the vagina for signs of BV. Your caregiver will perform lab tests and look at the sample of vaginal fluid through a microscope. They will look for bacteria and abnormal cells (clue cells), a pH test higher than 4.5, and a positive amine test all associated with BV.  RISKS AND COMPLICATIONS   Pelvic inflammatory disease (PID).   Infections following gynecology surgery.   Developing HIV.   Developing herpes virus.  TREATMENT  Sometimes BV will  clear up without treatment. However, all women with symptoms of BV should be treated to avoid complications, especially if gynecology surgery is planned. Female partners generally do not need to be treated. However, BV may spread between female sex partners so treatment is helpful in preventing a recurrence of BV.   BV may be treated with antibiotics. The antibiotics come in either pill or vaginal cream forms. Either can be used with nonpregnant or pregnant women, but the recommended dosages differ. These antibiotics are not harmful to the baby.   BV can recur after treatment. If this happens, a second round of antibiotics will often be prescribed.   Treatment is important for pregnant women. If not treated, BV can cause a premature delivery, especially for a pregnant woman who had a premature birth in the past. All pregnant women who have symptoms of BV should be checked and treated.   For chronic reoccurrence of BV, treatment with a type of prescribed gel vaginally twice a week is helpful.  HOME CARE INSTRUCTIONS   Finish all medication as directed by your caregiver.   Do not have sex until treatment is completed.   Tell your sexual partner that you have a vaginal infection. They should see their caregiver and be treated if they have problems, such as a mild rash or itching.   Practice safe sex. Use condoms. Only have 1 sex partner.  PREVENTION  Basic prevention steps can help reduce the risk of upsetting the natural balance of bacteria in the  vagina and developing BV:  Do not have sexual intercourse (be abstinent).   Do not douche.   Use all of the medicine prescribed for treatment of BV, even if the signs and symptoms go away.   Tell your sex partner if you have BV. That way, they can be treated, if needed, to prevent reoccurrence.  SEEK MEDICAL CARE IF:   Your symptoms are not improving after 3 days of treatment.   You have increased discharge, pain, or fever.  MAKE SURE YOU:    Understand these instructions.   Will watch your condition.   Will get help right away if you are not doing well or get worse.  FOR MORE INFORMATION  Division of STD Prevention (DSTDP), Centers for Disease Control and Prevention: [employer] American Social Health Association (ASHA): www.ashastd.org  Document Released: 06/24/2005 Document Revised: 03/06/2011 Document Reviewed: 12/15/2008 [facility] Patient Information 2012 [place], [place].

## 2011-08-13 NOTE — Progress Notes (Signed)
Patient presented to the office today with a complaint of a lesion noted inner genital area. She has a new sexual partner. Patient also wanted to have an STD screen. She felt her vagina felt swollen. And she was having some foul odor in her urine and some frequency.  GC and Chlamydia culture were obtained as well as an HIV RPR hepatitis B and C. results pending at time of this dictation  A wet prep demonstrated evidence of clue cells (bacterial vaginosis) with many bacteria.  Pelvic exam: Bartholin urethra Skene glands within normal limits the area between the left labia majora and her left buttock was a 2-1/2 cm red raised lesion suspicious for psoriasis was noted. There was no other lesions in the vagina or cervix on gross inspection. Picture below:  Physical Exam  Genitourinary:      this area was cleansed with Betadine solution and 1% lidocaine was infiltrated for a total of 3 cc. A keypunch biopsy was obtained and tissue submitted for histological evaluation. Silver nitrate was used for hemostasis and 2% lidocaine gel was applied to the area as well.  Patient will be placed on Taclonex ointment to apply each bedtime for 4 weeks. Will notify her when the results of the biopsy returned as well as the full STD screen. Her urinalysis was evident for urinary tract infection and she will be placed on Macrobid one by mouth twice a day for 7 days. For her bacterial vaginosis she will be placed on Cleocin vaginal cream to apply each bedtime for 5 days.

## 2011-08-14 LAB — HEPATITIS C ANTIBODY: HCV Ab: NEGATIVE

## 2011-08-14 LAB — GC/CHLAMYDIA PROBE AMP, GENITAL: GC Probe Amp, Genital: NEGATIVE

## 2011-08-15 ENCOUNTER — Ambulatory Visit (INDEPENDENT_AMBULATORY_CARE_PROVIDER_SITE_OTHER): Payer: 59 | Admitting: Gynecology

## 2011-08-15 ENCOUNTER — Encounter: Payer: Self-pay | Admitting: Gynecology

## 2011-08-15 VITALS — BP 138/90

## 2011-08-15 DIAGNOSIS — N901 Moderate vulvar dysplasia: Secondary | ICD-10-CM

## 2011-08-15 NOTE — Progress Notes (Signed)
[name] is an 50 y.o. female. Presented to the office today for followup discussion of her recent vulvar biopsy. She was seen in the office on February 5 complaining of a lesion noted on her genital area. A red raised area in the perineal area was noted and was biopsied. It was suspicious for psoriasis and she was placed on Taclonex cream. The pathology report:  HIGH GRADE VULVAR INTRAEPITHELIAL NEOPLASIA (VIN-II).  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 2011 normal Pap smear  Detail colposcopic evaluation today demonstrated the area from previous biopsy near the perineum and a small pigmented area medial to that. No other abnormalities were noted in the external genitalia are perirectal region. Acetic acid was applied and the entire vagina was inspected no abnormality was noted.  Pertinent Gynecological History: Menses: Prior hysterectomy Bleeding: Prior hysterectomy Contraception: none DES exposure: denies Blood transfusions: none Sexually transmitted diseases: no past history Previous GYN Procedures: Cervical conization, vaginal hysterectomy, laser ablation of dysplasia  Last mammogram: normal Date: 2011 Last pap: normal Date: 2011 OB History: G3, P3    Menstrual History: Menarche age: 12 No LMP recorded. Patient has had a hysterectomy.    Past Medical History  Diagnosis Date  . NSVD (normal spontaneous vaginal delivery)     X3.,  WITH PRE-ECLAMPSIA  . 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 PMDD (premenstrual dysphoric disorder)   . Plantar fasciitis   . GERD (gastroesophageal reflux disease)   . Hypertension   . Anxiety   . Insomnia   . Cancer     CERVICAL      Past Surgical History  Procedure Date  . Cesarean section   . Cervical biopsy  w/ loop electrode excision   . Eye surgery     BILATERAL CATARACT.. IMPLANTS  . Abdominal hysterectomy     [facility]  . Back surgery 1999    L5-S1 DISK ..   . Ganglion cyst excision     LEFT   . Knee surgery     TORN MENISCUS, LATERAL RELEASE  . 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 Abdominal surgery 01/30/2011    abdominoplasty     Family History  Problem Relation Age of Onset  . Breast cancer Mother   . Diabetes Mother   . Hypertension Mother   . Hypertension Father   . Cancer Maternal Grandmother     COLON  . Breast cancer Maternal Grandmother   . Hypertension Maternal Grandfather   . Diabetes Paternal Grandmother   . Diabetes Paternal Grandfather   . Heart disease Paternal Grandfather     MASSIVE MI    Social History:  reports that she has never smoked. She has never used smokeless tobacco. She reports that she drinks alcohol. She reports that she does not use illicit drugs.  Allergies:  Allergies  Allergen Reactions  . Codeine Itching     (Not in a hospital admission)  @ROS@  Blood pressure 138/90.  Physical Exam:  HEENT:unremarkable Neck:Supple, midline, no thyroid megaly, no carotid bruits Lungs:  Clear to auscultation no rhonchi's or wheezes Heart:Regular rate and rhythm, no murmurs or gallops Breast Exam: not examined Abdomen: Soft nontender no rebound or guarding   Pelvic:BUS within normal limits with the exception of the above-noted findings Vagina: No gross lesions oral colposcopic evaluation Cervix: Absent Uterus: Absent Adnexa: No palpable masses or tenderness Extremities: No cords, no edema Rectal: No external lesions noted on colposcopic examination  No results found for this or any previous visit (from the past 24 hour(s)).  No results found.  Assessment/Plan: Patient with recent diagnosis VIN II of the perineum. We discussed different treatment options  to include laser versus chemical treatment with imiquimod versus wide local excision. With a history of multifocal disease and she's had through the years he would best be served with a wide local excision to make sure the margins are clear and also to excise this small pigmented area medial to the previous biopsy site. This would not only be therapeutic and also to rule out a high-grade lesion. The risks benefits and pros and cons of the epic procedure were discussed with the patient all questions are answered and we'll follow accordingly. Literature information was provided.  [name] H 08/15/2011, 1:55 PM   

## 2011-08-15 NOTE — Patient Instructions (Signed)
Vulvar intraepithelial neoplasia  Author Thayer Ohm, MD Section Editor Alvera Novel, MD Deputy Editor Morton Amy, MD Disclosures  All topics are updated as new evidence becomes available and our peer review process is complete.  Literature review current through: Jan 2013.  This topic last updated: Dec 17, 2010.  INTRODUCTION - In 2004, the Vulvar Oncology Subcommittee of the International Society for the Study of Vulvar Diseases (ISSVD) developed the current classification system for vulvar intraepithelial neoplasia (VIN) [1]. This disorder is classified into two main groups: (1) VIN, usual type, which encompasses the former subcategories of VIN, warty type; VIN, basaloid type; and VIN, mixed (warty, basaloid) type (2) VIN, differentiated type, which encompasses the former category simplex type Rare cases that do not fit into these categories are termed "unclassified type." TERMINOLOGY - Previously, VIN was categorized as VIN 1, 2 and 3, according to the degree of abnormality (table 1). Since there is no evidence that what had been termed VIN 1 is a cancer precursor requiring treatment, the ISSVD committee recommended that the term VIN be limited to histologically high-grade squamous lesions (formerly termed VIN 2 and VIN 3) for which treatment is indicated to prevent progression to cancer. This topic review will adhere to the 2004 ISSVD nomenclature whenever possible. INCIDENCE - The incidence of VIN is increasing worldwide, primarily due to the increasing occurrence of this disease in young women, who account for 75 percent of cases [2,3]. In a study based on data from a [place] national cancer database, in 2000 the incidence of VIN 3 was 2.86 per 100,000 women [2]. The incidence of vulvar cancer is also increasing, but at a slower rate. VIN, USUAL TYPE - VIN lesions can be subdivided based on their morphologic and histological features (picture 1). Although each of these  types of VIN exist in pure form, mixtures of warty and basaloid VIN are common and are now classified together as usual type. The basaloid subtype has a thickened epithelium with a relatively flat, smooth surface. Histologically, it consists of atypical immature parabasal type cells with numerous mitotic figures and enlarged hyperchromatic nuclei.  The warty (condylomatous) subtype is characterized by a surface that is undulating or spiking, giving it a condylomatous appearance. Histologically, it consists of marked cellular proliferation with numerous mitotic figures and abnormal maturation. VIN, usual type typically occurs in younger, premenopausal women. Risk factors are similar to those for vulvar cancer of the warty or basaloid type, and include human papillomavirus (HPV) infection, cigarette smoking, and immunodeficiency or immunosuppression [4-7]. Human papillomavirus - Almost 90 percent of all VIN lesions test positive for HPV [8]. HPV has been associated with both VIN and vulvar cancer of the warty and basaloid subtypes. Multifocal VIN and multicentric VIN are most often associated with high oncogenic risk HPV subtypes 16, 18, and 31 [9-12], and should be considered premalignant lesions. In contrast, vulvar condyloma acuminata and the former VIN 1 are usually, but not exclusively, associated with low oncogenic risk HPV subtypes 6 and 11. (See "Cervical intraepithelial neoplasia: Definition, incidence, and pathogenesis", section on 'Role of human papillomavirus' and "Condylomata acuminata (anogenital warts)".)  Cigarette smoking - Cigarette smoking has been consistently associated with development of VIN [13-15]. As an example, one series reported 65 of 37 women with VIN were cigarette smokers compared to 5 of 40 age-matched women with nonneoplastic vulvar disease (68 versus 13 percent) [16]. The mechanism for the association is unknown.  Immunodeficiency - Vulvar/perianal intraepithelial neoplasia is  more common in  women infected with human immunodeficiency virus (HIV) than in uninfected controls. (See "Vulvar and vaginal intraepithelial neoplasia in HIV-infected women".) Pathogenesis - The anogenital epithelium is derived from the embryonic cloaca and includes the cervix, vagina, vulva, anus, and lower three centimeters of rectal mucosa up to the dentate line. Since the entire region shares the same embryological origin and is susceptible to similar exogenous agents (eg, HPV infection), squamous intraepithelial lesions in this area are often both multifocal (ie, multiple foci of disease within the same organ) and multicentric (ie, foci of disease involving more than one organ). VIN, usual type is often multifocal and multicentric. The interlabial grooves, posterior fourchette, and perineum are most frequently affected by multifocal lesions; more extensive disease is often confluent, involving the labia majora, minora, and perianal skin. Confluent or multifocal lesions exist in up to two-thirds of women with VIN [17]. Approximately 60 percent of women with vaginal intraepithelial neoplasia (VAIN) 3 or VIN have preexisting or synchronous cervical intraepithelial neoplasia (CIN), and close to 10 percent of women with CIN 3 have concomitant preinvasive disease at other sites (eg, 3 percent have VAIN 3 and 7 percent have VIN) [9,18,19]. There is also evidence that some cases of high grade VIN and VAIN represent a monoclonal lesion derived from high grade or malignant cervical lesions [20]. VIN, DIFFERENTIATED TYPE - Differentiated VIN comprises less than 5 percent of VIN and typically occurs in postmenopausal women. It is usually unifocal and unicentric and often associated with lichen sclerosus, but not with HPV infection. Differentiated VIN is commonly found adjacent to keratinizing squamous cell carcinoma or in patients with a history of vulvar cancer. The differentiated (simplex) type refers to lesions in  which the epithelium is thickened and parakeratotic with elongated and anastomosing rete ridges. The abnormal cells are confined to the parabasal and basal portion of the rete pegs with little or no atypia above the basal or parabasal layers [21]. The basal cells usually stain positively for p53, and p53 positive cells extend above the basal layers into the upper layers of the epidermis. Differentiated VIN is probably a precursor of HPV-negative vulvar cancer [22,23]. CLINICAL MANIFESTATIONS - Pruritus is the most common complaint among symptomatic women. Other presentations include a visible lesion (see 'Physical examination' below), a palpable abnormality, perineal pain or burning, or dysuria. Approximately 50 percent of women are asymptomatic and are diagnosed either when a lesion is observed during a routine gynecological examination or at colposcopy for abnormal cervical cytology. DIAGNOSTIC EVALUATION - Tissue biopsy is necessary for a definitive diagnosis. Appropriate sites for biopsy are identified by physical examination and colposcopy. Physical examination - The gynecological examination should include thorough inspection and palpation of the vulva and groin for masses, ulceration, or color changes. Most VIN lesions are multifocal and located in the nonhairy part of the vulva [24]. The lesions are often raised or verrucous and white (picture 2), but the color may be red (picture 3), pink, gray, or brown (picture 4). Macular lesions mostly occur on adjacent mucosal surfaces. There is no pathognomonic clinical appearance, and more than one of these patterns may be seen in the same patient. Suspicious lesions should be biopsied. Lichen sclerosus or lichen planus, condylomata acuminata, and condyloma lata can mimic VIN. If one of these disorders is suspected clinically but the lesion does not completely resolve or is refractory to therapy, then it should be biopsied to obtain a definitive  diagnosis. Colposcopy - Colposcopy may identify subclinical lesions not appreciated on physical examination and helps  to define the extent of disease and guide biopsy. Because of the high prevalence of multicentric synchronous or metachronous intraepithelial lesions, comprehensive colposcopic evaluation of the entire lower genital tract and perianal area is indicated in any patient with VIN. If colposcopy is not available, a hand lens provides similar magnification and is easy to use. (See "Colposcopy".) Cotton balls containing 2 to 5 percent acetic acid are applied to the vulva for several minutes prior to the procedure. Intraepithelial neoplasia produces well-demarcated, dense acetowhite lesions with or without punctation. Vascular abnormalities are a sign of invasive cancer, although high-grade preinvasive lesions may have subtle or absent vascular patterns. Some clinicians then paint the vulva with 1 percent toluidine blue, which is washed off with 1 percent acetic acid after two minutes [25,26]. Any abnormal areas will retain the blue dye and should be biopsied. However, false positives may occur due to infection or nonneoplastic ulceration, while false negatives may occur in thick hyperkeratotic lesions, ulcers, and abraded areas, which absorb only a small amount of dye. Biopsy - The biopsy site is infiltrated with a local anesthetic. For punch biopsies, the Keys dermatologic biopsy instrument enables accurate orientation of the specimen and evaluation of its full thickness for microinvasion. Disposable instruments for this purpose are available. Multiple biopsies may be required when there are multifocal lesions. Ferric subsulfate (Monsel's solution) or silver nitrate can be used for hemostasis; a suture is rarely needed. Excisional biopsies should be performed if a punch biopsy does not yield a satisfactory pathological specimen or for select cases with extensive ulceration or induration. Lesions  potentially suspicious for melanoma should always be evaluated with an excisional biopsy to provide a specimen that is appropriate for microstaging (ie, skin specimen is examined microscopically to determine tumor thickness and depth). VIN is frequently found in association with invasive squamous cell cancer (picture 1) or lichen sclerosus or lichen planus. These disorders can be difficult to distinguish from one another, especially when they occur concurrently. Therefore, any lesion on the vulva that is not known to be benign warrants biopsy. A vulva with multiple areas of abnormalities warrants multiple biopsies. (See "Vulvar cancer: Clinical manifestations, diagnosis, and pathology" and "Vulvar lichen sclerosus" and "Vulvar lichen planus".) TREATMENT - The goals of treatment are to prevent development of invasive vulvar cancer and relieve symptoms, while preserving normal vulvar anatomy and function. Treatment of VIN must be individualized based upon biopsy results, location and extent of disease, and the woman's symptoms. Management options include: Wide local excision  Skinning vulvectomy  Laser ablation  Topical treatment Excisional treatment has a diagnostic advantage over ablative or medical management modalities in women with VIN since an occult invasive squamous cell carcinoma is present in many of these women; the risk is particularly high in women over age 55 [4,9]. Removing the entire lesion for histopathological examination facilitates diagnosis of these cases. Surgical - The major surgical interventions for VIN appear to be similarly effective. In a literature review of 1905 surgically treated patients, the frequency of recurrence after vulvectomy, partial vulvectomy, local excision, and laser ablation was 19, 18, 22, and 23 percent, respectively, within 12 to 75 months (mean 39 months) [27]. The immediate precursor of HPV-negative vulvar squamous cell carcinoma is differentiated VIN. Due to  its unifocal nature and high malignant potential, wide local excision is preferred for treatment of differentiated VIN. Wide local excision - Local excision of an individual lesion with a one centimeter margin followed by reapproximation of the defect generally provides satisfactory cosmetic results.  While the gross surgical margin after vulvar resection is reduced by 15 percent when measured in the final fixed specimen, a grossly negative 1 cm margin will rarely harbor significant disease [28]. Removal of the epidermis provides sufficient depth for treatment of VIN as long as the margins are clear. Removing a small amount of underlying dermis helps to insure the absence of early invasive disease. Excision can be performed with a knife, electrosurgery, or carbon dioxide laser. (See "Vulvar wide local excision, simple vulvectomy, and skinning vulvectomy", section on 'Wide local excision'.) Laser ablation - Laser vaporization was introduced as an alternative to excisional therapy, especially for women with multifocal and extensive lesions. Only one laser treatment is required in 75 to 80 percent of patients [29-31]. It is most advantageous when there are multiple small lesions [32,33]. Since tissue is ablated rather than excised, the coexistence of invasive cancer must be carefully excluded by liberal use of colposcopically directed biopsies prior to the procedure. The goal of laser vaporization is to treat the entire area of intraepithelial abnormality. Colposcopy is used to control the depth of tissue destruction to less than 1 mm, which will ablate the intraepithelial lesion and allow for rapid healing. While 1 mm depth is sufficient ablation for hair free epithelium, 3 mm depth is required in hairy areas of the vulva because the hair root sheet tends to extend as deep as 2.5 mm and is at significant risk for harboring VIN [34]. Superficial laser vaporization may offer cosmetic advantages over skinning  vulvectomy. In contrast, deep laser vaporization results in destruction of the skin appendage, which leads to hypertrophic scar formation, thus negating the cosmetic advantages of laser over excisional surgery. Simple vulvectomy - Simple, or total, vulvectomy refers to removal of the entire vulva together with perineal tissues, as indicated, and usually including some subcutaneous tissue [35]. It may be performed for benign and premalignant conditions of the vulva that are extensive or multifocal. (See "Vulvar wide local excision, simple vulvectomy, and skinning vulvectomy", section on 'Simple vulvectomy'.) Skinning vulvectomy - Skinning vulvectomy is rarely indicated and is reserved for extensive VIN lesions that are either multifocal or large and confluent. It is used in the small proportion of patients in whom prior treatments such as topical treatments, laser ablation, or smaller excisions have failed to control disease. The procedure requires removing the vulvar skin along a relatively avascular plane beneath the epidermis, while preserving the subcutaneous tissue [36,37]. Primary closure can be achieved by either reapproximation or by using a split thickness skin graft. (See "Vulvar wide local excision, simple vulvectomy, and skinning vulvectomy", section on 'Skinning vulvectomy'.) Medical - Conservative therapies aimed at preserving the vulvar anatomy have been attempted, particularly in younger patients. Careful colposcopic examination with biopsies to exclude invasive disease is mandatory prior to beginning treatment with any of these drugs. Imiquimod - Imiquimod cream (Aldara) is a topical immune response modifier; its antiviral and antitumor effects are mediated through the stimulation of local cytokine production and cell-mediated immunity. Imiquimod appears to be effective therapy for high-grade VIN; however further high quality data are needed. In particular, studies comparing imiquimod with other  therapies are needed. A systematic review of treatment of high-grade VIN that included the two randomized trials and eight observational studies (total n=162) reported a complete response rate of 51 percent, partial response rate of 25 percent, and a recurrence rate of 16 percent [38]. The two randomized trials (n = 52 and 31) found that imiquimod was significantly more effective than placebo (  response rate for placebo was zero in both trials) [39,40]. In one randomized trial, at 12 months after initiation of therapy, vulvar carcinoma had developed in one of 26 patients treated with imiquimod (the patient had a partial response to therapy; the malignancy developed at a new site) compared with two of 26 patients in the placebo group [39]. At seven-year follow-up, there were two additional vulvar cancers, both in women with a partial or no response to initial therapy. Long-term follow-up data were not reported for the placebo group [41].  Imiquimod is applied topically to individual lesions, not to the entire vulva. A typical course of therapy is 16 weeks. Side effects of imiquimod are common; up to two-thirds of patients reduce the number of applications due to local side-effects, mostly from three to two times per week [39,40]. Side effects of imiquimod consist mostly of inflammation at the application site, including mild to moderate erythema or erosions. To reduce the incidence of local inflammation, some experts suggest an escalating dose regimen starting with an application once a week for two weeks, then twice a week for two weeks, then, if tolerated well, three times a week [40,42]. Topical 5-fluorouracil - Application of 5-fluorouracil cream causes a chemical desquamation of the VIN lesion; response rates as high as 75 percent have been reported [43,44]. A disadvantage of this approach is that often it is poorly tolerated because of significant burning, pain, inflammation, edema, or painful ulcerations.  For this reason, topical 5-FU has a limited role in the primary therapy of VIN. Investigational therapies - Photodynamic therapy [45], ultrasonic surgical aspiration [46,47], and the use of chemopreventive agents, such as retinyl acetate gel, are also being investigated. A pilot study of cidofovir, a potent antiviral agent, showed promising results [48]. There have been preliminary investigations regarding treatment of women with established VIN benefit with HPV vaccination with vaccines designed to elicit a cellular immune response (commercially available vaccines elicit only a humoral response) [8,49-52]. Management of VIN in pregnancy - Data regarding VIN and pregnancy are extremely limited. Approximately 15 percent of vulvar carcinomas have been reported to occur in women under the age of 88 [53]. Thus, any vulvar lesion noted during pregnancy should be biopsied as outlined above for the non-pregnant patient. Management options for the pregnant patient with VIN fall principally into two main categories: Surgical therapy with either local excision or ablative therapy should follow the same general principles as for the non-pregnant patient. This is the preferred treatment option for VIN in pregnancy, especially for the patient remote from delivery.  Expectant management until after delivery. Once invasive cancer has been ruled out histologically, clinicians may consider deferring treatment of VIN to the postpartum period especially in patients who are diagnosed in the third trimester. Small series suggest a possibility for spontaneous regression particularly in asymptomatic women who are younger than 51 years of age and who present with multifocal pigmented VIN 2-3 lesions [54]. While several of these spontaneous regressions were associated with pregnancy, there are no data regarding the incidence of regression in this patient population.  Medical therapy is generally not recommended: Imiquimod is classified  as a category C drug by the FDA and should be used during pregnancy only if the potential benefit justifies the potential risk to the fetus [55]. The safety of topical therapy with imiquimod during pregnancy has not been clearly established [56,57], nor are there efficacy data on the use of imiquimod for the treatment of VIN in pregnancy. 5-FU is a category D  drug and should not be used for the treatment of VIN in pregnancy. Management of VIN in HIV-infected women - Individuals infected with HIV are at greater risk of HPV-related cancers. Vulvar intraepithelial neoplasia occurs commonly among women infected with HIV. (See "Vulvar and vaginal intraepithelial neoplasia in HIV-infected women".) PROGNOSIS Natural history - If left untreated, VIN may persist, progress, or resolve [27,58]. A literature review of patients with VIN included 10 studies involving 61 untreated patients and 27 patients in whom macroscopic VIN was left behind after treatment [27]. Eight of the 88 patients (9 percent) progressed to invasive vulvar cancer over one to eight years; four of these women had had previous radiation therapy to the lower genital tract and one was immunosuppressed. In addition, 13 studies reported complete spontaneous regression in a total of 41 patients. All of these women were under age 57 years (mean age 40 years); regression was related to pregnancy in 31 cases. Recurrence after treatment - With prolonged follow-up, at least one-third of women develop recurrent VIN, regardless of the treatment modality employed [4,9,27,32,33,36,59,60]. Risk factors for recurrent disease include immunosuppression, the presence of multifocal or multicentric disease, or positive margins on the biopsy specimen [4,9,32]. In two series, the risk of recurrent VIN was almost threefold higher among women with positive compared to negative margins on the excisional biopsy (46 to 50 versus 15 to 17 percent) [4,60]. Therefore, long-term  surveillance of the entire lower genital tract is mandatory. We suggest follow-up every six months for five years after the last treatment and then annually. Cigarette smoking is also a risk factor for recurrence. Patients should be encouraged to stop using tobacco to reduce their recurrence risk and for the general health benefits of not smoking. Carcinoma in patients with treated VIN - Multiple small studies have reported that 4 to 8 percent of patients develop locally invasive cancer following initial treatment of VIN 3 [9,32,33,60-62]. A systematic review of 3322 patients with VIN 3 found that 6.5 percent developed invasive vulvar cancer, one-half of these were occult carcinomas detected during diagnostic evaluation and therapy and the other one-half were discovered during follow-up [27]. There appear to be two distinct patterns of invasive vulvar cancer in women treated for VIN. One pattern is development of invasive carcinoma at a prior site of frequently incompletely treated VIN and reflects disease progression. In these cases, cancer is observed after a median of 2.4 years following VIN treatment. In the other pattern, an invasive carcinoma develops several years later at a site distinct from the previously treated VIN and represents a new neoplastic change in the area at risk [60]. PREVENTION - A policy of prophylactic HPV vaccination could prevent two-thirds of vulvar, vaginal, and perianal intraepithelial neoplasia in younger women [12,63,64]. (See "Recommendations for the use of human papillomavirus vaccines".) SUMMARY AND RECOMMENDATIONS In 2004, the Vulvar Oncology Subcommittee of the International Society for the Study of Vulvar Diseases (ISSVD) recommended that the term vulvar intraepithelial neoplasia (VIN) be used exclusively for describing high grade lesions and that it take the place of the former terms VIN 2 and 3. The term VIN 1 was dropped, as it is not a premalignant lesion. (See  'Introduction' above.)  The prevalence of VIN is higher in premenopausal women than postmenopausal women. In premenopausal women, VIN is often associated with HPV infection, immunosuppression, or cigarette smoking, and is multifocal. Postmenopausal women are more likely to have non-HPV associated VIN and unifocal lesions. (See 'VIN, usual type' above and 'VIN, differentiated type' above.)  Pruritus is the most common symptom of VIN. Other presentations include a visible lesion, a palpable abnormality, pain, or dysuria; but many women are asymptomatic. Squamous intraepithelial neoplasia in more than one location (vulva, vagina, cervix, or perianal area) is relatively common. (See 'Clinical manifestations' above and 'Pathogenesis' above.)  Tissue biopsy is necessary for a definitive diagnosis. VIN is frequently found in association with invasive squamous cell cancer or lichen sclerosus or lichen planus. These three disorders can be difficult to distinguish from one another, especially when they occur concurrently. Therefore, any lesion on the vulva that is not known to be benign warrants biopsy. Appropriate sites for biopsy are identified by physical examination and colposcopy. (See 'Diagnostic evaluation' above.)  The goals of treatment are to prevent development of invasive vulvar cancer and relieve symptoms, while preserving normal vulvar anatomy and function. Treatment is individualized based upon biopsy results, extent of disease, and the woman's symptoms. (See 'Treatment' above.)  For women with VIN, we suggest surgery rather than medical therapy (Grade 2C). We recommend wide local excision if there is a significant risk of invasion based on patient age or the characteristics of the lesion. We suggest laser ablation or topical therapy with imiquimod for patients whose lesions are located such that excision with negative margins is not feasible without significant vulvar mutilation, such as clitoral,  periurethral or perianal lesions. A combination of these approaches may be indicated in some patients with extensive disease. (See 'Surgical' above.)  Despite treatment, VIN recurs in one-third of women and 4 to 8 percent develop locally invasive cancer. Therefore, long-term surveillance of the entire lower genital tract is mandatory. We suggest follow-up every six months for five years after the last treatment and then annually. (See 'Prognosis' above.)

## 2011-08-16 ENCOUNTER — Telehealth: Payer: Self-pay

## 2011-08-16 ENCOUNTER — Other Ambulatory Visit: Payer: Self-pay

## 2011-08-16 NOTE — Telephone Encounter (Signed)
I spoke with patient and we scheduled her surgery for Monday, Feb 18th 7:30am.  She will be off Mon and Tuesday.  She said last time she had this type surgery Dr. Glenetta Hew prescribed some Viscous Lidocaine Solution that she put on the area and it helped a lot.  She will have to work [employer]. (has new job as Visual merchandiser) and said this will help her.  OK to prescribe this for her?

## 2011-08-19 ENCOUNTER — Other Ambulatory Visit: Payer: Self-pay | Admitting: Gynecology

## 2011-08-19 MED ORDER — LIDOCAINE VISCOUS 2 % MT SOLN: 20.0000 mL | OROMUCOSAL | Status: AC | PRN

## 2011-08-19 NOTE — Telephone Encounter (Deleted)
Okay per Dr. Velvet Bathe- He fowarded me a surgery request slip. I will contact patient.

## 2011-08-20 ENCOUNTER — Encounter (HOSPITAL_BASED_OUTPATIENT_CLINIC_OR_DEPARTMENT_OTHER): Payer: Self-pay | Admitting: *Deleted

## 2011-08-21 ENCOUNTER — Encounter (HOSPITAL_BASED_OUTPATIENT_CLINIC_OR_DEPARTMENT_OTHER): Payer: Self-pay | Admitting: *Deleted

## 2011-08-21 NOTE — Telephone Encounter (Signed)
I spoke with Dr. Glenetta Hew today. He has already prescribed the Lidocaine as requested.

## 2011-08-21 NOTE — Progress Notes (Signed)
NPO AFTER MN. ARRIVES AT 0615. NEEDS CBC, BMET, UA, AND EKG. (LABS TO BE DONE AT [facility]). WILL TAKE XANAX AM OF SURG. W/ SIP OF WATER.

## 2011-08-22 ENCOUNTER — Encounter: Payer: Self-pay | Admitting: Internal Medicine

## 2011-08-22 ENCOUNTER — Ambulatory Visit (HOSPITAL_COMMUNITY): Admission: RE | Admit: 2011-08-22 | Payer: 59 | Source: Ambulatory Visit | Admitting: Gynecology

## 2011-08-22 ENCOUNTER — Ambulatory Visit (INDEPENDENT_AMBULATORY_CARE_PROVIDER_SITE_OTHER): Payer: 59 | Admitting: Internal Medicine

## 2011-08-22 ENCOUNTER — Encounter (HOSPITAL_COMMUNITY): Admission: RE | Payer: Self-pay | Source: Ambulatory Visit

## 2011-08-22 DIAGNOSIS — I10 Essential (primary) hypertension: Secondary | ICD-10-CM

## 2011-08-22 DIAGNOSIS — Z0000 Encounter for general adult medical examination without abnormal findings: Secondary | ICD-10-CM

## 2011-08-22 DIAGNOSIS — Z79899 Other long term (current) drug therapy: Secondary | ICD-10-CM

## 2011-08-22 DIAGNOSIS — Z23 Encounter for immunization: Secondary | ICD-10-CM

## 2011-08-22 DIAGNOSIS — F39 Unspecified mood [affective] disorder: Secondary | ICD-10-CM

## 2011-08-22 LAB — POCT UA - MICROSCOPIC ONLY
Casts, Ur, LPF, POC: NEGATIVE
WBC, Ur, HPF, POC: NEGATIVE
Yeast, UA: NEGATIVE

## 2011-08-22 LAB — POCT CBC
Granulocyte percent: 62.4 %G (ref 37–80)
HCT, POC: 43.4 % (ref 37.7–47.9)
MID (cbc): 0.6 (ref 0–0.9)
POC Granulocyte: 5.6 (ref 2–6.9)
POC LYMPH PERCENT: 31.4 %L (ref 10–50)
Platelet Count, POC: 417 10*3/uL (ref 142–424)
RBC: 4.64 M/uL (ref 4.04–5.48)
RDW, POC: 12.7 %

## 2011-08-22 LAB — POCT URINALYSIS DIPSTICK
Bilirubin, UA: NEGATIVE
Glucose, UA: NEGATIVE
Leukocytes, UA: NEGATIVE
Nitrite, UA: NEGATIVE
Urobilinogen, UA: 0.2

## 2011-08-22 LAB — BASIC METABOLIC PANEL
BUN: 17 mg/dL (ref 6–23)
GFR calc Af Amer: >90 mL/min (ref >90)
GFR calc non Af Amer: >90 mL/min (ref >90)
Potassium: 3.6 mEq/L (ref 3.5–5.1)

## 2011-08-22 LAB — URINALYSIS, ROUTINE W REFLEX MICROSCOPIC
Glucose, UA: NEGATIVE mg/dL
Leukocytes, UA: NEGATIVE
Protein, ur: NEGATIVE mg/dL
Urobilinogen, UA: 0.2 mg/dL (ref 0.0–1.0)

## 2011-08-22 LAB — GLUCOSE, POCT (MANUAL RESULT ENTRY): POC Glucose: 72

## 2011-08-22 LAB — CBC
Hemoglobin: 14 g/dL (ref 12.0–15.0)
MCHC: 34.1 g/dL (ref 30.0–36.0)
RDW: 12.2 % (ref 11.5–15.5)

## 2011-08-22 LAB — POCT GLYCOSYLATED HEMOGLOBIN (HGB A1C): Hemoglobin A1C: 5.3

## 2011-08-22 SURGERY — BIOPSY, VULVA
Anesthesia: General

## 2011-08-22 MED ORDER — VALSARTAN-HYDROCHLOROTHIAZIDE 320-25 MG PO TABS: 1.0000 | ORAL_TABLET | Freq: Every morning | ORAL | Status: DC

## 2011-08-22 MED ORDER — ALPRAZOLAM 1 MG PO TABS: 1.0000 mg | ORAL_TABLET | Freq: Every morning | ORAL | Status: DC

## 2011-08-22 NOTE — Progress Notes (Signed)
  Subjective:    Patient ID: [name], female    DOB: June 04, 1961, 51 y.o.   MRN: [id number]  HPI HTN controlled Mood disorder treated OSA treated CIN,VIN followed and tx by Dr. Lily Peer  See scanned history   Review of Systems See scanned ROS Objective:   Physical Exam  Constitutional: She is oriented to person, place, and time. She appears well-developed and well-nourished.  HENT:  Head: Normocephalic.  Right Ear: External ear normal.  Left Ear: External ear normal.  Mouth/Throat: Oropharynx is clear and moist.  Eyes: EOM are normal. Pupils are equal, round, and reactive to light.  Neck: Normal range of motion. Neck supple. No tracheal deviation present. No thyromegaly present.  Cardiovascular: Normal rate, regular rhythm and normal heart sounds.   Pulmonary/Chest: Effort normal and breath sounds normal.  Abdominal: Soft. Bowel sounds are normal.  Musculoskeletal: Normal range of motion.  Lymphadenopathy:    She has no cervical adenopathy.  Neurological: She is alert and oriented to person, place, and time. She has normal reflexes. Coordination normal.  Skin: Skin is warm and dry.  Psychiatric: She has a normal mood and affect.          Assessment & Plan:  Healthy CPE EKG Referral for colonoscopy Start Hep. B series Refill all meds 1 year

## 2011-08-23 LAB — COMPREHENSIVE METABOLIC PANEL
ALT: 20 U/L (ref 0–35)
AST: 24 U/L (ref 0–37)
Albumin: 4.4 g/dL (ref 3.5–5.2)
Alkaline Phosphatase: 58 U/L (ref 39–117)
BUN: 18 mg/dL (ref 6–23)
Potassium: 4 mEq/L (ref 3.5–5.3)
Sodium: 138 mEq/L (ref 135–145)
Total Protein: 7.6 g/dL (ref 6.0–8.3)

## 2011-08-23 LAB — LIPID PANEL
LDL Cholesterol: 158 mg/dL — ABNORMAL HIGH (ref 0–99)
Triglycerides: 132 mg/dL (ref <150)
VLDL: 26 mg/dL (ref 0–40)

## 2011-08-23 LAB — TSH: TSH: 2.113 u[IU]/mL (ref 0.350–4.500)

## 2011-08-26 ENCOUNTER — Encounter (HOSPITAL_BASED_OUTPATIENT_CLINIC_OR_DEPARTMENT_OTHER): Payer: Self-pay | Admitting: *Deleted

## 2011-08-26 ENCOUNTER — Encounter (HOSPITAL_BASED_OUTPATIENT_CLINIC_OR_DEPARTMENT_OTHER)
Admission: RE | Disposition: A | Discharge status: Home / Self Care | Payer: Self-pay | Source: Ambulatory Visit | Attending: Gynecology

## 2011-08-26 ENCOUNTER — Other Ambulatory Visit: Payer: Self-pay | Admitting: Gynecology

## 2011-08-26 ENCOUNTER — Other Ambulatory Visit: Payer: Self-pay

## 2011-08-26 ENCOUNTER — Encounter (HOSPITAL_BASED_OUTPATIENT_CLINIC_OR_DEPARTMENT_OTHER): Payer: Self-pay | Admitting: Anesthesiology

## 2011-08-26 ENCOUNTER — Ambulatory Visit (HOSPITAL_BASED_OUTPATIENT_CLINIC_OR_DEPARTMENT_OTHER)
Admission: RE | Admit: 2011-08-26 | Discharge: 2011-08-26 | Disposition: A | Discharge status: Home / Self Care | Payer: 59 | Source: Ambulatory Visit | Attending: Gynecology | Admitting: Gynecology

## 2011-08-26 ENCOUNTER — Ambulatory Visit (HOSPITAL_BASED_OUTPATIENT_CLINIC_OR_DEPARTMENT_OTHER): Payer: 59 | Admitting: Anesthesiology

## 2011-08-26 DIAGNOSIS — Z01812 Encounter for preprocedural laboratory examination: Secondary | ICD-10-CM | POA: Insufficient documentation

## 2011-08-26 DIAGNOSIS — N901 Moderate vulvar dysplasia: Secondary | ICD-10-CM

## 2011-08-26 DIAGNOSIS — I10 Essential (primary) hypertension: Secondary | ICD-10-CM | POA: Insufficient documentation

## 2011-08-26 DIAGNOSIS — C519 Malignant neoplasm of vulva, unspecified: Secondary | ICD-10-CM | POA: Insufficient documentation

## 2011-08-26 DIAGNOSIS — D280 Benign neoplasm of vulva: Secondary | ICD-10-CM | POA: Insufficient documentation

## 2011-08-26 DIAGNOSIS — Z8541 Personal history of malignant neoplasm of cervix uteri: Secondary | ICD-10-CM | POA: Insufficient documentation

## 2011-08-26 DIAGNOSIS — Z90710 Acquired absence of both cervix and uterus: Secondary | ICD-10-CM | POA: Insufficient documentation

## 2011-08-26 DIAGNOSIS — F39 Unspecified mood [affective] disorder: Secondary | ICD-10-CM

## 2011-08-26 HISTORY — DX: Anesthesia of skin: R20.0

## 2011-08-26 HISTORY — DX: Obstructive sleep apnea (adult) (pediatric): G47.33

## 2011-08-26 HISTORY — DX: Polyneuropathy, unspecified: G62.9

## 2011-08-26 HISTORY — DX: Malignant neoplasm of cervix uteri, unspecified: C53.9

## 2011-08-26 HISTORY — DX: Gastro-esophageal reflux disease without esophagitis: K21.9

## 2011-08-26 HISTORY — DX: Obstructive sleep apnea (adult) (pediatric): Z99.89

## 2011-08-26 HISTORY — PX: VULVA /PERINEUM BIOPSY: SHX319

## 2011-08-26 SURGERY — BIOPSY, VULVA
Anesthesia: General | Site: Vulva | Wound class: Clean

## 2011-08-26 MED ORDER — LACTATED RINGERS IV SOLN
INTRAVENOUS | Status: DC
  Administered 2011-08-26: 07:00:00 via INTRAVENOUS

## 2011-08-26 MED ORDER — KETOROLAC TROMETHAMINE 30 MG/ML IJ SOLN
15.0000 mg | Freq: Once | INTRAMUSCULAR | Status: AC | PRN
  Administered 2011-08-26: 30 mg via INTRAVENOUS

## 2011-08-26 MED ORDER — LACTATED RINGERS IV SOLN
INTRAVENOUS | Status: DC | PRN
  Administered 2011-08-26: 07:00:00 via INTRAVENOUS

## 2011-08-26 MED ORDER — FENTANYL CITRATE 0.05 MG/ML IJ SOLN: 25.0000 ug | INTRAMUSCULAR | Status: DC | PRN

## 2011-08-26 MED ORDER — CEFAZOLIN SODIUM 1-5 GM-% IV SOLN: 1.0000 g | INTRAVENOUS | Status: DC

## 2011-08-26 MED ORDER — LIDOCAINE-EPINEPHRINE 1 %-1:100000 IJ SOLN
INTRAMUSCULAR | Status: DC | PRN
  Administered 2011-08-26: 18 mL

## 2011-08-26 MED ORDER — MIDAZOLAM HCL 5 MG/5ML IJ SOLN
INTRAMUSCULAR | Status: DC | PRN
  Administered 2011-08-26: 1 mg via INTRAVENOUS
  Administered 2011-08-26: 2 mg via INTRAVENOUS
  Administered 2011-08-26: 1 mg via INTRAVENOUS

## 2011-08-26 MED ORDER — OXYCODONE-ACETAMINOPHEN 5-325 MG PO TABS: 1.0000 | ORAL_TABLET | ORAL | Status: AC | PRN

## 2011-08-26 MED ORDER — CEFAZOLIN SODIUM 1-5 GM-% IV SOLN
INTRAVENOUS | Status: DC | PRN
  Administered 2011-08-26: 2 g via INTRAVENOUS

## 2011-08-26 MED ORDER — ONDANSETRON HCL 4 MG/2ML IJ SOLN
INTRAMUSCULAR | Status: DC | PRN
  Administered 2011-08-26: 4 mg via INTRAVENOUS

## 2011-08-26 MED ORDER — PROPOFOL 10 MG/ML IV EMUL
INTRAVENOUS | Status: DC | PRN
  Administered 2011-08-26: 40 mg via INTRAVENOUS
  Administered 2011-08-26 (×2): 30 mg via INTRAVENOUS

## 2011-08-26 MED ORDER — FENTANYL CITRATE 0.05 MG/ML IJ SOLN
INTRAMUSCULAR | Status: DC | PRN
  Administered 2011-08-26 (×2): 25 ug via INTRAVENOUS
  Administered 2011-08-26: 50 ug via INTRAVENOUS

## 2011-08-26 MED ORDER — ACETIC ACID 5 % SOLN
Status: DC | PRN
  Administered 2011-08-26: 1 via TOPICAL

## 2011-08-26 MED ORDER — METOCLOPRAMIDE HCL 10 MG PO TABS: 10.0000 mg | ORAL_TABLET | Freq: Three times a day (TID) | ORAL | Status: AC

## 2011-08-26 MED ORDER — PROMETHAZINE HCL 25 MG/ML IJ SOLN: 6.2500 mg | INTRAMUSCULAR | Status: DC | PRN

## 2011-08-26 MED ORDER — CEFAZOLIN SODIUM-DEXTROSE 2-3 GM-% IV SOLR: 2.0000 g | INTRAVENOUS | Status: DC

## 2011-08-26 SURGICAL SUPPLY — 44 items
BLADE SURG 15 STRL LF DISP TIS (BLADE) ×1 IMPLANT
BLADE SURG 15 STRL SS (BLADE) ×2
CANISTER SUCTION 1200CC (MISCELLANEOUS) IMPLANT
CANISTER SUCTION 2500CC (MISCELLANEOUS) IMPLANT
CATH ROBINSON RED A/P 14FR (CATHETERS) IMPLANT
CLOTH BEACON ORANGE TIMEOUT ST (SAFETY) ×2 IMPLANT
COVER TABLE BACK 60X90 (DRAPES) ×2 IMPLANT
DRAPE LG THREE QUARTER DISP (DRAPES) ×2 IMPLANT
DRAPE UNDERBUTTOCKS STRL (DRAPE) ×2 IMPLANT
DRESSING TELFA 8X3 (GAUZE/BANDAGES/DRESSINGS) ×1 IMPLANT
DRSG TEGADERM 2-3/8X2-3/4 SM (GAUZE/BANDAGES/DRESSINGS) ×1 IMPLANT
ELECT NDL TIP 2.8 STRL (NEEDLE) IMPLANT
ELECT NEEDLE TIP 2.8 STRL (NEEDLE) IMPLANT
ELECT REM PT RETURN 9FT ADLT (ELECTROSURGICAL) ×2
ELECTRODE REM PT RTRN 9FT ADLT (ELECTROSURGICAL) ×1 IMPLANT
GLOVE ECLIPSE 6.0 STRL STRAW (GLOVE) ×1 IMPLANT
GLOVE ECLIPSE 8.0 STRL XLNG CF (GLOVE) ×1 IMPLANT
GLOVE INDICATOR 6.5 STRL GRN (GLOVE) ×1 IMPLANT
GOWN PREVENTION PLUS LG XLONG (DISPOSABLE) ×2 IMPLANT
GOWN STRL REIN XL XLG (GOWN DISPOSABLE) ×2 IMPLANT
LEGGING LITHOTOMY PAIR STRL (DRAPES) ×2 IMPLANT
NS IRRIG 500ML POUR BTL (IV SOLUTION) ×1 IMPLANT
PACK BASIN DAY SURGERY FS (CUSTOM PROCEDURE TRAY) ×2 IMPLANT
PAD OB MATERNITY 4.3X12.25 (PERSONAL CARE ITEMS) ×1 IMPLANT
PAD PREP 24X48 CUFFED NSTRL (MISCELLANEOUS) ×2 IMPLANT
PENCIL BUTTON HOLSTER BLD 10FT (ELECTRODE) ×2 IMPLANT
SCOPETTES 8  STERILE (MISCELLANEOUS) ×2
SCOPETTES 8 STERILE (MISCELLANEOUS) IMPLANT
SUT ETHILON 3 0 PS 1 (SUTURE) ×1 IMPLANT
SUT VIC AB 1-0 CT2 27 (SUTURE) ×1 IMPLANT
SUT VIC AB 2-0 PS2 27 (SUTURE) ×2 IMPLANT
SUT VIC AB 3-0 CT1 27 (SUTURE)
SUT VIC AB 3-0 CT1 27XBRD (SUTURE) IMPLANT
SUT VIC AB 3-0 FS2 27 (SUTURE) ×2 IMPLANT
SUT VIC AB 3-0 X1 27 (SUTURE) IMPLANT
SWAB CULTURE LIQ STUART DBL (MISCELLANEOUS) IMPLANT
SYRINGE IRR TOOMEY STRL 70CC (SYRINGE) IMPLANT
TOWEL OR 17X24 6PK STRL BLUE (TOWEL DISPOSABLE) ×4 IMPLANT
TRAY DSU PREP LF (CUSTOM PROCEDURE TRAY) ×2 IMPLANT
TUBE ANAEROBIC SPECIMEN COL (MISCELLANEOUS) ×1 IMPLANT
TUBE CONNECTING 12X1/4 (SUCTIONS) IMPLANT
VACUUM HOSE 7/8X10 W/ WAND (MISCELLANEOUS) ×1 IMPLANT
WATER STERILE IRR 500ML POUR (IV SOLUTION) ×2 IMPLANT
YANKAUER SUCT BULB TIP NO VENT (SUCTIONS) IMPLANT

## 2011-08-26 NOTE — Anesthesia Procedure Notes (Addendum)
Procedure Name: MAC Date/Time: 08/26/2011 7:48 AM Performed by: Iline Oven   Procedure Name: MAC Date/Time: 08/26/2011 7:48 AM Performed by: Iline Oven Pre-anesthesia Checklist: Patient identified, Emergency Drugs available, Suction available, Patient being monitored and Timeout performed Oxygen Delivery Method: Simple face mask Preoxygenation: Pre-oxygenation with 100% oxygen Intubation Type: IV induction

## 2011-08-26 NOTE — Anesthesia Preprocedure Evaluation (Addendum)
Anesthesia Evaluation  Patient identified by MRN, date of birth, ID band Patient awake    Reviewed: Allergy & Precautions, H&P , NPO status , Patient's Chart, lab work & pertinent test results  Airway Mallampati: II TM Distance: <3 FB Neck ROM: Full    Dental No notable dental hx.    Pulmonary neg pulmonary ROS, sleep apnea ,  clear to auscultation  Pulmonary exam normal       Cardiovascular hypertension, Regular Normal    Neuro/Psych Anxiety Depression Negative Neurological ROS     GI/Hepatic negative GI ROS, Neg liver ROS,   Endo/Other  Morbid obesity  Renal/GU negative Renal ROS  Genitourinary negative   Musculoskeletal negative musculoskeletal ROS (+)   Abdominal (+) obese,   Peds negative pediatric ROS (+)  Hematology negative hematology ROS (+)   Anesthesia Other Findings   Reproductive/Obstetrics negative OB ROS                           Anesthesia Physical Anesthesia Plan  ASA: III  Anesthesia Plan: MAC   Post-op Pain Management:    Induction: Intravenous  Airway Management Planned: Simple Face Mask  Additional Equipment:   Intra-op Plan:   Post-operative Plan:   Informed Consent: I have reviewed the patients History and Physical, chart, labs and discussed the procedure including the risks, benefits and alternatives for the proposed anesthesia with the patient or authorized representative who has indicated his/her understanding and acceptance.   Dental advisory given  Plan Discussed with: CRNA  Anesthesia Plan Comments: (Pt prefers MAC, GA as backup)       Anesthesia Quick Evaluation

## 2011-08-26 NOTE — H&P (View-Only) (Signed)
[name] is an 51 y.o. female. Presented to the office today for followup discussion of her recent vulvar biopsy. She was seen in the office on February 5 complaining of a lesion noted on her genital area. A red raised area in the perineal area was noted and was biopsied. It was suspicious for psoriasis and she was placed on Taclonex cream. The pathology report:  HIGH GRADE VULVAR INTRAEPITHELIAL NEOPLASIA (VIN-II).  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 2011 normal Pap smear  Detail colposcopic evaluation today demonstrated the area from previous biopsy near the perineum and a small pigmented area medial to that. No other abnormalities were noted in the external genitalia are perirectal region. Acetic acid was applied and the entire vagina was inspected no abnormality was noted.  Pertinent Gynecological History: Menses: Prior hysterectomy Bleeding: Prior hysterectomy Contraception: none DES exposure: denies Blood transfusions: none Sexually transmitted diseases: no past history Previous GYN Procedures: Cervical conization, vaginal hysterectomy, laser ablation of dysplasia  Last mammogram: normal Date: 2011 Last pap: normal Date: 2011 OB History: G3, P3    Menstrual History: Menarche age: 49 No LMP recorded. Patient has had a hysterectomy.    Past Medical History  Diagnosis Date  . NSVD (normal spontaneous vaginal delivery)     X3.,  WITH PRE-ECLAMPSIA  . 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 PMDD (premenstrual dysphoric disorder)   . Plantar fasciitis   . GERD (gastroesophageal reflux disease)   . Hypertension   . Anxiety   . Insomnia   . Cancer     CERVICAL      Past Surgical History  Procedure Date  . Cesarean section   . Cervical biopsy  w/ loop electrode excision   . Eye surgery     BILATERAL CATARACT.. IMPLANTS  . Abdominal hysterectomy     TVH  . Back surgery 1999    L5-S1 DISK ..   . Ganglion cyst excision     LEFT   . Knee surgery     TORN MENISCUS, LATERAL RELEASE  . 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 Abdominal surgery 01/30/2011    abdominoplasty     Family History  Problem Relation Age of Onset  . Breast cancer Mother   . Diabetes Mother   . Hypertension Mother   . Hypertension Father   . Cancer Maternal Grandmother     COLON  . Breast cancer Maternal Grandmother   . Hypertension Maternal Grandfather   . Diabetes Paternal Grandmother   . Diabetes Paternal Grandfather   . Heart disease Paternal Grandfather     MASSIVE MI    Social History:  reports that she has never smoked. She has never used smokeless tobacco. She reports that she drinks alcohol. She reports that she does not use illicit drugs.  Allergies:  Allergies  Allergen Reactions  . Codeine Itching     (Not in a hospital admission)  @ROS @  Blood pressure 138/90.  Physical Exam:  HEENT:unremarkable Neck:Supple, midline, no thyroid megaly, no carotid bruits Lungs:  Clear to auscultation no rhonchi's or wheezes Heart:Regular rate and rhythm, no murmurs or gallops Breast Exam: not examined Abdomen: Soft nontender no rebound or guarding  Pelvic:BUS within normal limits with the exception of the above-noted findings Vagina: No gross lesions oral colposcopic evaluation Cervix: Absent Uterus: Absent Adnexa: No palpable masses or tenderness Extremities: No cords, no edema Rectal: No external lesions noted on colposcopic examination  No results found for this or any previous visit (from the past 24 hour(s)).  No results found.  Assessment/Plan: Patient with recent diagnosis VIN II of the perineum. We discussed different treatment options  to include laser versus chemical treatment with imiquimod versus wide local excision. With a history of multifocal disease and she's had through the years he would best be served with a wide local excision to make sure the margins are clear and also to excise this small pigmented area medial to the previous biopsy site. This would not only be therapeutic and also to rule out a high-grade lesion. The risks benefits and pros and cons of the epic procedure were discussed with the patient all questions are answered and we'll follow accordingly. Literature information was provided.  Shaquira Moroz H 08/15/2011, 1:55 PM

## 2011-08-26 NOTE — Op Note (Signed)
08/26/2011  8:31 AM  PATIENT:  [name]  51 y.o. female  PRE-OPERATIVE DIAGNOSIS:  vulvar intraepithelial neoplasia II  POST-OPERATIVE DIAGNOSIS:  vulvar intraepithelial neoplasia II  PROCEDURE:  Procedure(s): Wide local excision of vulvar lesion. And biopsy of a smaller pigmented vulvar lesion. VULVAR BIOPSY  SURGEON:  Surgeon(s): Ok Edwards, MD  ANESTHESIA:   local 1% lidocaine with 1 100,000 epinephrine (10 cc)  FINDINGS: A 2 half centimeter elliptical raised hyperemic scaled lesion noted in the left crural fold lateral to the left labia majora. A smaller dark pigmented lesion noted for finger breaths and medial to larger lesion.  DESCRIPTION OF OPERATION: The patient was taken to the operating room where she underwent intravenous sedation. She was placed in the eye lithotomy position. The area was cleansed with Betadine solution. 1% lidocaine with 1 100,000 epinephrine was infiltrated subdermally to both lesions to be excised. Lugol solution was applied and the colposcope was brought into view and the area to be excised was marked allowing wide margins from the lesions. With a scalpel an elliptical incision was made and a large incision and the lesion was excised and passed off the operative field histological evaluation on a cork board. The small incision was excised and submitted separately as well. The elliptical incision site was reapproximated using 1-0 Vicryl suture subdermally with interrupted sutures. And the skin was reapproximated with interrupted sutures of 3-0 nylon. The small lesion just required Bovie cauterization. The patient tolerated procedure well and was transferred to recovery stable vital signs. She redid receive a gram of Cefotan IV prophylactically preop. She'll be given Toradol 30 mg IV in route to the recovery room.  ESTIMATED BLOOD LOSS: * No blood loss amount entered *   Intake/Output Summary (Last 24 hours) at 08/26/11 0831 Last data filed at  08/26/11 0825  Gross per 24 hour  Intake    500 ml  Output      0 ml  Net    500 ml     BLOOD ADMINISTERED:none   LOCAL MEDICATIONS USED:  XYLOCAINE   SPECIMEN:  Source of Specimen:  2 vulvar biopsy specimen submitted  DISPOSITION OF SPECIMEN:  PATHOLOGY  COUNTS:  YES  PLAN OF CARE: Transfer to PACU  [facility] HMD8:31 AMTD@

## 2011-08-26 NOTE — Transfer of Care (Signed)
Immediate Anesthesia Transfer of Care Note  Patient: [name]  Procedure(s) Performed: Procedure(s) (LRB): VULVAR BIOPSY (N/A)  Patient Location: PACU  Anesthesia Type: General  Level of Consciousness: awake, sedated, patient cooperative and responds to stimulation  Airway & Oxygen Therapy: Patient Spontanous Breathing and Patient connected to face mask oxygen  Post-op Assessment: Report given to PACU RN, Post -op Vital signs reviewed and stable and Patient moving all extremities  Post vital signs: Reviewed and stable  Complications: No apparent anesthesia complications

## 2011-08-26 NOTE — Anesthesia Postprocedure Evaluation (Signed)
  Anesthesia Post-op Note  Patient: [name]  Procedure(s) Performed: Procedure(s) (LRB): VULVAR BIOPSY (N/A)  Patient Location: PACU  Anesthesia Type: MAC  Level of Consciousness: awake and alert   Airway and Oxygen Therapy: Patient Spontanous Breathing  Post-op Pain: mild  Post-op Assessment: Post-op Vital signs reviewed, Patient's Cardiovascular Status Stable, Respiratory Function Stable, Patent Airway and No signs of Nausea or vomiting  Post-op Vital Signs: stable  Complications: No apparent anesthesia complications

## 2011-08-26 NOTE — Interval H&P Note (Signed)
History and Physical Interval Note:  08/26/2011 7:27 AM  [name]  has presented today for surgery, with the diagnosis of vulvar intraepithelial neoplasia II  The various methods of treatment have been discussed with the patient and family. After consideration of risks, benefits and other options for treatment, the patient has consented to  Procedure(s) (LRB): VULVAR BIOPSY (N/A) as a surgical intervention .  The patients' history has been reviewed, patient examined, no change in status, stable for surgery.  I have reviewed the patients' chart and labs.  Questions were answered to the patient's satisfaction.     Ok Edwards

## 2011-08-27 ENCOUNTER — Other Ambulatory Visit: Payer: Self-pay | Admitting: *Deleted

## 2011-08-27 ENCOUNTER — Encounter (HOSPITAL_BASED_OUTPATIENT_CLINIC_OR_DEPARTMENT_OTHER): Payer: Self-pay | Admitting: Gynecology

## 2011-08-27 DIAGNOSIS — Z87440 Personal history of urinary (tract) infections: Secondary | ICD-10-CM

## 2011-08-27 DIAGNOSIS — D729 Disorder of white blood cells, unspecified: Secondary | ICD-10-CM

## 2011-08-28 ENCOUNTER — Telehealth: Payer: Self-pay | Admitting: Gynecology

## 2011-08-28 NOTE — Telephone Encounter (Signed)
I received a phone call today  from Dr. Nedra Hai (pathologist) in reference to [name]'s wide local excision pathology report which had been  done on February 18 as a result of a previous outpatient biopsy which demonstrated VIN 2. The following result was discussed with [name]:  FINAL DIAGNOSIS Diagnosis 1. Labium, biopsy, left crural area, lateral to left labia majora - INVASIVE WELL DIFFERENTIATED SQUAMOUS CELL CARCINOMA. - MARGINS CLEAR. - PLEASE SEE ONCOLOGY TEMPLATE FOR DETAILS. 2. Labium, biopsy, small isolated pigmented area inferior and lateral to left labia majora - BENIGN MELANOCYTIC NEVUS, COMPOUND TYPE, MARGINS NOT INVOLVED. Microscopic Comment 1. VULVA Specimen: Vulva Procedure: Resection Lymph node sampling performed: No Tumor site: Left crural area Tumor focality: Unifocal Maximum tumor size (cm): 0.4 cm, glass measurement. Histologic type: Invasive squamous cell carcinoma Grade: G1, well differentiated Depth of stromal invasion (mm): 2 mm Margins: Negative Distance of carcinoma from nearest margin: Deep margin: 0.2 cm Lymph - Vascular invasion: No Lymph nodes: N/A TNM code: pT1b, pNx FIGO Stage (based on pathologic findings, needs clinical correlation): IB 2. There is a proliferation of banal melanocytes at the dermal epidermal junction arranged mostly in nests with nevus cells in the dermis, which are generally smaller as they are deeper. There is no significant atypia. Some of the change maybe site related. Immunostains for S-100 and Melan A are confirmatory. Dr. Pershing Proud has reviewed this case and agrees. Case was discussed with Dr. Lily Peer on 08-28-2011. Abigail Miyamoto MD  She will be referred to [facility] GYN oncologist at [facility] who will see her tomorrow at 11:30 AM to discuss treatment options. Patient was informed questions are answered and we'll follow accordingly.

## 2011-08-29 ENCOUNTER — Encounter: Payer: Self-pay | Admitting: Gynecologic Oncology

## 2011-08-29 ENCOUNTER — Ambulatory Visit: Payer: 59 | Attending: Gynecologic Oncology | Admitting: Gynecologic Oncology

## 2011-08-29 VITALS — BP 124/76 | HR 86 | Temp 98.1°F | Resp 18 | Ht 65.5 in | Wt 227.8 lb

## 2011-08-29 DIAGNOSIS — F411 Generalized anxiety disorder: Secondary | ICD-10-CM | POA: Insufficient documentation

## 2011-08-29 DIAGNOSIS — Z90710 Acquired absence of both cervix and uterus: Secondary | ICD-10-CM | POA: Insufficient documentation

## 2011-08-29 DIAGNOSIS — G4733 Obstructive sleep apnea (adult) (pediatric): Secondary | ICD-10-CM | POA: Insufficient documentation

## 2011-08-29 DIAGNOSIS — C519 Malignant neoplasm of vulva, unspecified: Secondary | ICD-10-CM | POA: Insufficient documentation

## 2011-08-29 DIAGNOSIS — K219 Gastro-esophageal reflux disease without esophagitis: Secondary | ICD-10-CM | POA: Insufficient documentation

## 2011-08-29 DIAGNOSIS — Z803 Family history of malignant neoplasm of breast: Secondary | ICD-10-CM | POA: Insufficient documentation

## 2011-08-29 DIAGNOSIS — Z800 Family history of malignant neoplasm of digestive organs: Secondary | ICD-10-CM | POA: Insufficient documentation

## 2011-08-29 DIAGNOSIS — Z885 Allergy status to narcotic agent status: Secondary | ICD-10-CM | POA: Insufficient documentation

## 2011-08-29 DIAGNOSIS — I10 Essential (primary) hypertension: Secondary | ICD-10-CM | POA: Insufficient documentation

## 2011-08-29 DIAGNOSIS — G609 Hereditary and idiopathic neuropathy, unspecified: Secondary | ICD-10-CM | POA: Insufficient documentation

## 2011-08-29 DIAGNOSIS — Z8541 Personal history of malignant neoplasm of cervix uteri: Secondary | ICD-10-CM | POA: Insufficient documentation

## 2011-08-29 NOTE — Patient Instructions (Signed)
I will call you with the surgical oncology recommendations.
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
## 2011-09-02 ENCOUNTER — Ambulatory Visit: Payer: 59 | Admitting: Gynecology

## 2011-09-04 ENCOUNTER — Ambulatory Visit (INDEPENDENT_AMBULATORY_CARE_PROVIDER_SITE_OTHER): Payer: 59 | Admitting: Gynecology

## 2011-09-04 ENCOUNTER — Encounter: Payer: Self-pay | Admitting: Gynecology

## 2011-09-04 DIAGNOSIS — Z87440 Personal history of urinary (tract) infections: Secondary | ICD-10-CM

## 2011-09-04 DIAGNOSIS — C519 Malignant neoplasm of vulva, unspecified: Secondary | ICD-10-CM

## 2011-09-04 DIAGNOSIS — D729 Disorder of white blood cells, unspecified: Secondary | ICD-10-CM

## 2011-09-04 DIAGNOSIS — R7309 Other abnormal glucose: Secondary | ICD-10-CM

## 2011-09-04 LAB — CBC WITH DIFFERENTIAL/PLATELET
Basophils Absolute: 0 10*3/uL (ref 0.0–0.1)
Basophils Relative: 1 % (ref 0–1)
Eosinophils Absolute: 0.2 10*3/uL (ref 0.0–0.7)
Eosinophils Relative: 2 % (ref 0–5)
HCT: 43.2 % (ref 36.0–46.0)
MCH: 30.8 pg (ref 26.0–34.0)
MCHC: 32.6 g/dL (ref 30.0–36.0)
MCV: 94.3 fL (ref 78.0–100.0)
Monocytes Absolute: 0.5 10*3/uL (ref 0.1–1.0)
Platelets: 449 10*3/uL — ABNORMAL HIGH (ref 150–400)
RDW: 12.5 % (ref 11.5–15.5)
WBC: 8.3 10*3/uL (ref 4.0–10.5)

## 2011-09-04 NOTE — Progress Notes (Signed)
Patient presented to the office today for a postop visit. 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 Labium, biopsy, left crural area, lateral to left labia majora - INVASIVE WELL DIFFERENTIATED SQUAMOUS CELL CARCINOMA. - MARGINS CLEAR. - PLEASE SEE ONCOLOGY TEMPLATE FOR DETAILS. 2. Labium, biopsy, small isolated pigmented area inferior and lateral to left labia majora - BENIGN MELANOCYTIC NEVUS, COMPOUND TYPE, MARGINS NOT INVOLVED  08/29/2011 patient referred to GYN oncologist Dr. Duard Brady and we'll be doing a broader and deeper groin dissection with consultation with general surgeon in the event that this lesion originates from the skin and not from the vulva. She presented today for postop visit the area is healing well slightly erythematous. We'll leave the sutures in place for one more week since it isn't an area that could cause the edges to separate with movement. She was instructed to apply Bactroban cream 3 times a day and to keep the area clean. She will followup with Dr. Duard Brady in the upcoming surgery in March. We appreciate Dr. Duard Brady seen her in consultation and await final evaluation and treatment.

## 2011-09-05 LAB — URINALYSIS W MICROSCOPIC + REFLEX CULTURE
Bacteria, UA: NONE SEEN
Bilirubin Urine: NEGATIVE
Glucose, UA: NEGATIVE mg/dL
Ketones, ur: NEGATIVE mg/dL
Protein, ur: NEGATIVE mg/dL
Squamous Epithelial / LPF: NONE SEEN
Urobilinogen, UA: 0.2 mg/dL (ref 0.0–1.0)

## 2011-09-10 ENCOUNTER — Encounter: Payer: Self-pay | Admitting: Gynecology

## 2011-09-10 ENCOUNTER — Ambulatory Visit (INDEPENDENT_AMBULATORY_CARE_PROVIDER_SITE_OTHER): Payer: 59 | Admitting: Gynecology

## 2011-09-10 VITALS — BP 130/88

## 2011-09-10 DIAGNOSIS — C519 Malignant neoplasm of vulva, unspecified: Secondary | ICD-10-CM

## 2011-09-10 NOTE — Patient Instructions (Signed)
Will call you as soon as I talk with Dr. Rica Records or her staff as to location of surgery as well asif node dissection will be don and if colonoscopy is recommended before surgery.

## 2011-09-10 NOTE — Progress Notes (Signed)
Patient presented to the office today for a postop visit and to remove sutures.  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 Labium, biopsy, left crural area, lateral to left labia majora  - INVASIVE WELL DIFFERENTIATED SQUAMOUS CELL CARCINOMA.  - MARGINS CLEAR.  - PLEASE SEE ONCOLOGY TEMPLATE FOR DETAILS.  2. 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 Patient scheduled for surgery with Dr. Rockney Ghee on March 22 for her clinical stage I vulvar carcinoma for a wider and deeper excision. Patient not certain if her  surgery is going to be done here in [place] or at [facility] and whetherlymph nodes sampling will be undertaken or not. She also had questions whether her colonoscopy that was scheduled should be done before her surgery or not. We'll contact Dr. Denman George office to clarify these questions with the patient. She'll continue to do the sitz baths in apply the Bactroban cream to the area before bedtime.

## 2011-09-18 ENCOUNTER — Encounter (HOSPITAL_BASED_OUTPATIENT_CLINIC_OR_DEPARTMENT_OTHER): Admission: RE | Payer: Self-pay | Source: Ambulatory Visit

## 2011-09-18 ENCOUNTER — Ambulatory Visit (HOSPITAL_BASED_OUTPATIENT_CLINIC_OR_DEPARTMENT_OTHER): Admission: RE | Admit: 2011-09-18 | Payer: 59 | Source: Ambulatory Visit | Admitting: Gynecology

## 2011-09-18 SURGERY — BIOPSY, VULVA
Anesthesia: General

## 2011-09-24 ENCOUNTER — Ambulatory Visit (INDEPENDENT_AMBULATORY_CARE_PROVIDER_SITE_OTHER): Payer: 59 | Admitting: Internal Medicine

## 2011-09-24 DIAGNOSIS — Z23 Encounter for immunization: Secondary | ICD-10-CM

## 2011-09-24 MED ORDER — HEPATITIS B VAC RECOMBINANT 5 MCG/0.5ML IJ SUSP
0.5000 mL | Freq: Once | INTRAMUSCULAR | Status: AC
  Administered 2011-09-24: 5 ug via INTRAMUSCULAR

## 2011-09-24 NOTE — Progress Notes (Signed)
  Subjective:    Patient ID: [name], female    DOB: 08/22/1960, 51 y.o.   MRN: [id number]  HPI  Pt here for second Hep B, tol first injection without difficulty.    Review of Systems     Objective:   Physical Exam        Assessment & Plan:  Hep #2 given.

## 2011-09-25 ENCOUNTER — Encounter: Payer: 59 | Admitting: Internal Medicine

## 2011-09-27 DIAGNOSIS — IMO0002 Reserved for concepts with insufficient information to code with codable children: Secondary | ICD-10-CM

## 2011-09-27 HISTORY — DX: Reserved for concepts with insufficient information to code with codable children: IMO0002

## 2011-10-16 ENCOUNTER — Encounter: Payer: 59 | Admitting: Internal Medicine

## 2011-11-19 ENCOUNTER — Encounter: Payer: Self-pay | Admitting: Internal Medicine

## 2011-11-19 ENCOUNTER — Ambulatory Visit (AMBULATORY_SURGERY_CENTER): Payer: BC Managed Care – PPO

## 2011-11-19 VITALS — Ht 65.5 in | Wt 232.9 lb

## 2011-11-19 DIAGNOSIS — Z8371 Family history of colonic polyps: Secondary | ICD-10-CM

## 2011-11-19 DIAGNOSIS — Z1211 Encounter for screening for malignant neoplasm of colon: Secondary | ICD-10-CM

## 2011-11-19 DIAGNOSIS — Z800 Family history of malignant neoplasm of digestive organs: Secondary | ICD-10-CM

## 2011-11-19 DIAGNOSIS — Z83719 Family history of colon polyps, unspecified: Secondary | ICD-10-CM

## 2011-11-19 MED ORDER — PEG-KCL-NACL-NASULF-NA ASC-C 100 G PO SOLR: 1.0000 | Freq: Once | ORAL | Status: AC

## 2011-12-03 ENCOUNTER — Encounter: Payer: Self-pay | Admitting: Internal Medicine

## 2011-12-03 ENCOUNTER — Ambulatory Visit (AMBULATORY_SURGERY_CENTER): Payer: BC Managed Care – PPO | Admitting: Internal Medicine

## 2011-12-03 VITALS — BP 133/67 | HR 70 | Temp 98.3°F | Resp 14 | Ht 65.0 in | Wt 232.0 lb

## 2011-12-03 DIAGNOSIS — Z1211 Encounter for screening for malignant neoplasm of colon: Secondary | ICD-10-CM

## 2011-12-03 DIAGNOSIS — Z8371 Family history of colonic polyps: Secondary | ICD-10-CM

## 2011-12-03 MED ORDER — SODIUM CHLORIDE 0.9 % IV SOLN: 500.0000 mL | INTRAVENOUS | Status: DC

## 2011-12-03 NOTE — Op Note (Signed)
[facility] [address] [place], [place]  [address]  COLONOSCOPY PROCEDURE REPORT  PATIENT:  [name], [name]  MR#:  [id number] BIRTHDATE:  1960-12-06, 51 yrs. old  GENDER:  female ENDOSCOPIST:  Hedwig Morton. Juanda Chance, MD REF. BY:  Robert Bellow, M.D.,Dr Reynaldo Minium PROCEDURE DATE:  12/03/2011 PROCEDURE:  Colonoscopy 62130 ASA CLASS:  Class I INDICATIONS:  colorectal cancer screening, average risk, family Hx of polyps MEDICATIONS:   MAC sedation, administered by CRNA, propofol (Diprivan) 350 mg  DESCRIPTION OF PROCEDURE:   After the risks and benefits and of the procedure were explained, informed consent was obtained. Digital rectal exam was performed and revealed no rectal masses. The LB CF-[id number] endoscope was introduced through the anus and advanced to the cecum, which was identified by both the appendix and ileocecal valve.  The quality of the prep was good, using MoviPrep.  The instrument was then slowly withdrawn as the colon was fully examined. <<PROCEDUREIMAGES>>  FINDINGS:  No polyps or cancers were seen (see image1, image2, image3, and image4).   Retroflexed views in the rectum revealed no abnormalities.    The scope was then withdrawn from the patient and the procedure completed.  COMPLICATIONS:  None ENDOSCOPIC IMPRESSION: 1) No polyps or cancers 2) Normal colonoscopy RECOMMENDATIONS: 1) High fiber diet.  REPEAT EXAM:  In 10 year(s) for.  ______________________________ Hedwig Morton. Juanda Chance, MD  CC:  n. eSIGNED:   Hedwig Morton. Vicktoria Muckey at 12/03/2011 02:06 PM  Theodoro Kalata, [phone]

## 2011-12-03 NOTE — Progress Notes (Signed)
No complaints noted in the recovery room. Maw  Patient did not experience any of the following events: a burn prior to discharge; a fall within the facility; wrong site/side/patient/procedure/implant event; or a hospital transfer or hospital admission upon discharge from the facility. (G8907) Patient did not have preoperative order for IV antibiotic SSI prophylaxis. (G8918)  

## 2011-12-03 NOTE — Patient Instructions (Signed)
Handout was given to your care partner on a high fiber diet.  You may resume your prior medications today.  Please call if any questions or concerns.   YOU HAD AN ENDOSCOPIC PROCEDURE TODAY AT THE [facility]: Refer to the procedure report that was given to you for any specific questions about what was found during the examination.  If the procedure report does not answer your questions, please call your gastroenterologist to clarify.  If you requested that your care partner not be given the details of your procedure findings, then the procedure report has been included in a sealed envelope for you to review at your convenience later.  YOU SHOULD EXPECT: Some feelings of bloating in the abdomen. Passage of more gas than usual.  Walking can help get rid of the air that was put into your GI tract during the procedure and reduce the bloating. If you had a lower endoscopy (such as a colonoscopy or flexible sigmoidoscopy) you may notice spotting of blood in your stool or on the toilet paper. If you underwent a bowel prep for your procedure, then you may not have a normal bowel movement for a few days.  DIET: Your first meal following the procedure should be a light meal and then it is ok to progress to your normal diet.  A half-sandwich or bowl of soup is an example of a good first meal.  Heavy or fried foods are harder to digest and may make you feel nauseous or bloated.  Likewise meals heavy in dairy and vegetables can cause extra gas to form and this can also increase the bloating.  Drink plenty of fluids but you should avoid alcoholic beverages for 24 hours.  ACTIVITY: Your care partner should take you home directly after the procedure.  You should plan to take it easy, moving slowly for the rest of the day.  You can resume normal activity the day after the procedure however you should NOT DRIVE or use heavy machinery for 24 hours (because of the sedation medicines used during the test).     SYMPTOMS TO REPORT IMMEDIATELY: A gastroenterologist can be reached at any hour.  During normal business hours, 8:30 AM to 5:00 PM Monday through Friday, call [phone].  After hours and on weekends, please call the GI answering service at [phone] who will take a message and have the physician on call contact you.   Following lower endoscopy (colonoscopy or flexible sigmoidoscopy):  Excessive amounts of blood in the stool  Significant tenderness or worsening of abdominal pains  Swelling of the abdomen that is new, acute  Fever of 100F or higher    FOLLOW UP: If any biopsies were taken you will be contacted by phone or by letter within the next 1-3 weeks.  Call your gastroenterologist if you have not heard about the biopsies in 3 weeks.  Our staff will call the home number listed on your records the next business day following your procedure to check on you and address any questions or concerns that you may have at that time regarding the information given to you following your procedure. This is a courtesy call and so if there is no answer at the home number and we have not heard from you through the emergency physician on call, we will assume that you have returned to your regular daily activities without incident.  SIGNATURES/CONFIDENTIALITY: You and/or your care partner have signed paperwork which will be entered into your electronic medical  record.  These signatures attest to the fact that that the information above on your After Visit Summary has been reviewed and is understood.  Full responsibility of the confidentiality of this discharge information lies with you and/or your care-partner.

## 2011-12-04 ENCOUNTER — Telehealth: Payer: Self-pay | Admitting: *Deleted

## 2011-12-04 NOTE — Telephone Encounter (Signed)
No answer left message to call if questions or concerns. 

## 2011-12-22 ENCOUNTER — Ambulatory Visit (INDEPENDENT_AMBULATORY_CARE_PROVIDER_SITE_OTHER): Payer: BC Managed Care – PPO | Admitting: Family Medicine

## 2011-12-22 DIAGNOSIS — Z23 Encounter for immunization: Secondary | ICD-10-CM

## 2011-12-22 DIAGNOSIS — Z2839 Other underimmunization status: Secondary | ICD-10-CM

## 2012-01-17 ENCOUNTER — Telehealth: Payer: Self-pay

## 2012-01-17 NOTE — Telephone Encounter (Signed)
PT WOULD LIKE TO HAVE A 3 MONTH SUPPLY OF DIOVAN AND ALPRAZOLAM SENT OVER TO [facility] IN [place]. BEST#  [phone]

## 2012-01-18 NOTE — Telephone Encounter (Signed)
Clinical TL- On 08/22/2011 Dr. Perrin Maltese sent in DiovanHCT #90, RF x 3, so she should have plenty.  He also authorized the same of her alprazolam, but the board of pharmacy only allows 6 months.    Dr. Cleta Alberts- Are you willing to authorize the remaining 6 months of this patient's Alprazolam?  If so, please sign the pending Rx.

## 2012-01-18 NOTE — Telephone Encounter (Signed)
OK to call in a refill of her Xanax to last for the next 6 months

## 2012-01-19 ENCOUNTER — Telehealth: Payer: Self-pay | Admitting: Radiology

## 2012-01-19 MED ORDER — VALSARTAN-HYDROCHLOROTHIAZIDE 320-25 MG PO TABS: 1.0000 | ORAL_TABLET | Freq: Every morning | ORAL | Status: DC

## 2012-01-19 MED ORDER — ALPRAZOLAM 1 MG PO TABS: 1.0000 mg | ORAL_TABLET | Freq: Every morning | ORAL | Status: DC

## 2012-01-19 NOTE — Telephone Encounter (Signed)
Patient called. She is now using [facility] in [place]. She was using mail order, now is using [facility] so we need to refill her Diovan to [facility]. I have already called in the Xanax.

## 2012-01-19 NOTE — Telephone Encounter (Signed)
Called in Xanax and called the patient about her Xanax and Diovan.

## 2012-01-19 NOTE — Telephone Encounter (Signed)
Diovan HCT sent.

## 2012-01-19 NOTE — Telephone Encounter (Signed)
LMOM that rx was sent

## 2012-04-21 ENCOUNTER — Ambulatory Visit: Payer: BC Managed Care – PPO | Admitting: Gynecology

## 2012-04-24 ENCOUNTER — Encounter: Payer: Self-pay | Admitting: Women's Health

## 2012-04-24 ENCOUNTER — Ambulatory Visit (INDEPENDENT_AMBULATORY_CARE_PROVIDER_SITE_OTHER): Payer: BC Managed Care – PPO | Admitting: Women's Health

## 2012-04-24 DIAGNOSIS — R82998 Other abnormal findings in urine: Secondary | ICD-10-CM

## 2012-04-24 DIAGNOSIS — R8290 Unspecified abnormal findings in urine: Secondary | ICD-10-CM

## 2012-04-24 LAB — URINALYSIS W MICROSCOPIC + REFLEX CULTURE
Bilirubin Urine: NEGATIVE
Ketones, ur: NEGATIVE mg/dL
Nitrite: NEGATIVE
Protein, ur: NEGATIVE mg/dL
Urobilinogen, UA: 0.2 mg/dL (ref 0.0–1.0)

## 2012-04-24 NOTE — Progress Notes (Signed)
Patient ID: [name], female   DOB: 1961/06/14, 51 y.o.   MRN: [id number] Presents with complaint of a new bump near her rectum. History of squamous cell carcinoma diagnosed February  2013 on left inner thigh. Had a wide excision with margins free after at [facility]. Does monthly checks and felt a new nodule. Denies any vaginal discharge,  urinary symptoms or fever. TVH.  Exam: Well-healed incision left inner groin. Several small hemorrhoids noted, at 5:00 position 2 cm from rectum 2 small areas, nontender, nonindurated less than a centimeter noted. Dr. Lily Peer  presented to exam also.  Possible sebaceous cyst versus rectal irritation/history of squamous cell carcinoma  Plan: Sitz bath 3 times per day, loose clothing, return to office in 2 weeks for Dr. Lily Peer to reexamine.

## 2012-05-13 ENCOUNTER — Ambulatory Visit: Payer: BC Managed Care – PPO | Admitting: Gynecology

## 2012-06-25 ENCOUNTER — Other Ambulatory Visit: Payer: Self-pay | Admitting: *Deleted

## 2012-06-25 ENCOUNTER — Telehealth: Payer: Self-pay | Admitting: *Deleted

## 2012-06-25 NOTE — Telephone Encounter (Signed)
Pharmacy requesting refill on alprazolam 1mg . Last refil 04/26/12

## 2012-06-26 NOTE — Telephone Encounter (Signed)
Left message for patient to call me back. 

## 2012-06-26 NOTE — Telephone Encounter (Signed)
Needs OFFICE VISIT FOR MORE.  

## 2012-06-26 NOTE — Telephone Encounter (Signed)
This is a patient of Dr. Perrin Maltese. He did her physical in March. Please forward this to him for his approval regarding her Xanax prescription.

## 2012-07-02 ENCOUNTER — Telehealth: Payer: Self-pay
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
## 2012-07-03 ENCOUNTER — Encounter (HOSPITAL_BASED_OUTPATIENT_CLINIC_OR_DEPARTMENT_OTHER): Payer: Self-pay

## 2012-07-03 ENCOUNTER — Emergency Department (HOSPITAL_BASED_OUTPATIENT_CLINIC_OR_DEPARTMENT_OTHER)
Admission: EM | Admit: 2012-07-03 | Discharge: 2012-07-04 | Disposition: A | Discharge status: Home / Self Care | Payer: BC Managed Care – PPO | Attending: Emergency Medicine | Admitting: Emergency Medicine

## 2012-07-03 DIAGNOSIS — C519 Malignant neoplasm of vulva, unspecified: Secondary | ICD-10-CM | POA: Insufficient documentation

## 2012-07-03 DIAGNOSIS — Z203 Contact with and (suspected) exposure to rabies: Secondary | ICD-10-CM | POA: Insufficient documentation

## 2012-07-03 DIAGNOSIS — N893 Dysplasia of vagina, unspecified: Secondary | ICD-10-CM | POA: Insufficient documentation

## 2012-07-03 DIAGNOSIS — K219 Gastro-esophageal reflux disease without esophagitis: Secondary | ICD-10-CM | POA: Insufficient documentation

## 2012-07-03 DIAGNOSIS — F411 Generalized anxiety disorder: Secondary | ICD-10-CM | POA: Insufficient documentation

## 2012-07-03 DIAGNOSIS — Z209 Contact with and (suspected) exposure to unspecified communicable disease: Secondary | ICD-10-CM

## 2012-07-03 DIAGNOSIS — Z8669 Personal history of other diseases of the nervous system and sense organs: Secondary | ICD-10-CM | POA: Insufficient documentation

## 2012-07-03 DIAGNOSIS — Z79899 Other long term (current) drug therapy: Secondary | ICD-10-CM | POA: Insufficient documentation

## 2012-07-03 DIAGNOSIS — Z8742 Personal history of other diseases of the female genital tract: Secondary | ICD-10-CM | POA: Insufficient documentation

## 2012-07-03 DIAGNOSIS — C539 Malignant neoplasm of cervix uteri, unspecified: Secondary | ICD-10-CM | POA: Insufficient documentation

## 2012-07-03 DIAGNOSIS — I10 Essential (primary) hypertension: Secondary | ICD-10-CM | POA: Insufficient documentation

## 2012-07-03 DIAGNOSIS — Z8739 Personal history of other diseases of the musculoskeletal system and connective tissue: Secondary | ICD-10-CM | POA: Insufficient documentation
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
## 2012-07-03 NOTE — Telephone Encounter (Signed)
Patient needs office visit for renewal of her Xanax. She should call animal control for her bat. Left message for her to call me back.
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
## 2012-07-04 ENCOUNTER — Encounter (HOSPITAL_BASED_OUTPATIENT_CLINIC_OR_DEPARTMENT_OTHER): Payer: Self-pay | Admitting: Emergency Medicine

## 2012-07-04 MED ORDER — RABIES VACCINE, PCEC IM SUSR
1.0000 mL | Freq: Once | INTRAMUSCULAR | Status: AC
  Administered 2012-07-04: 1 mL via INTRAMUSCULAR
  Filled 2012-07-04: qty 1

## 2012-07-04 MED ORDER — RABIES IMMUNE GLOBULIN 150 UNIT/ML IM INJ
20.0000 [IU]/kg | INJECTION | Freq: Once | INTRAMUSCULAR | Status: AC
  Administered 2012-07-04: 2100 [IU] via INTRAMUSCULAR
  Filled 2012-07-04: qty 14
  Filled 2012-07-04: qty 4
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
## 2012-07-07 ENCOUNTER — Emergency Department (HOSPITAL_COMMUNITY)
Admission: EM | Admit: 2012-07-07 | Discharge: 2012-07-07 | Disposition: A | Discharge status: Home / Self Care | Payer: BC Managed Care – PPO | Source: Home / Self Care

## 2012-07-07 ENCOUNTER — Encounter (HOSPITAL_COMMUNITY): Payer: Self-pay | Admitting: *Deleted

## 2012-07-07 MED ORDER — RABIES VACCINE, PCEC IM SUSR
INTRAMUSCULAR | Status: AC
  Filled 2012-07-07: qty 1

## 2012-07-07 MED ORDER — RABIES VACCINE, PCEC IM SUSR
1.0000 mL | Freq: Once | INTRAMUSCULAR | Status: AC
  Administered 2012-07-07: 1 mL via INTRAMUSCULAR

## 2012-07-07 NOTE — ED Notes (Signed)
Pt      Here   Day  3    Of   Rabies

## 2012-07-08 ENCOUNTER — Other Ambulatory Visit: Payer: Self-pay | Admitting: *Deleted

## 2012-07-11 ENCOUNTER — Emergency Department (INDEPENDENT_AMBULATORY_CARE_PROVIDER_SITE_OTHER)
Admission: EM | Admit: 2012-07-11 | Discharge: 2012-07-11 | Disposition: A | Discharge status: Home / Self Care | Payer: BC Managed Care – PPO | Source: Home / Self Care

## 2012-07-11 ENCOUNTER — Encounter (HOSPITAL_COMMUNITY): Payer: Self-pay | Admitting: *Deleted

## 2012-07-11 DIAGNOSIS — Z23 Encounter for immunization: Secondary | ICD-10-CM

## 2012-07-11 MED ORDER — RABIES VACCINE, PCEC IM SUSR
1.0000 mL | Freq: Once | INTRAMUSCULAR | Status: AC
  Administered 2012-07-11: 1 mL via INTRAMUSCULAR

## 2012-07-11 MED ORDER — RABIES VACCINE, PCEC IM SUSR
INTRAMUSCULAR | Status: AC
  Filled 2012-07-11: qty 1

## 2012-07-11 NOTE — ED Notes (Signed)
Presents for rabies injection. 

## 2012-07-18 ENCOUNTER — Encounter (HOSPITAL_COMMUNITY): Payer: Self-pay | Admitting: Emergency Medicine

## 2012-07-18 ENCOUNTER — Emergency Department (INDEPENDENT_AMBULATORY_CARE_PROVIDER_SITE_OTHER)
Admission: EM | Admit: 2012-07-18 | Discharge: 2012-07-18 | Disposition: A | Discharge status: Home / Self Care | Payer: BC Managed Care – PPO | Source: Home / Self Care

## 2012-07-18 DIAGNOSIS — Z203 Contact with and (suspected) exposure to rabies: Secondary | ICD-10-CM

## 2012-07-18 MED ORDER — RABIES VACCINE, PCEC IM SUSR
1.0000 mL | Freq: Once | INTRAMUSCULAR | Status: AC
  Administered 2012-07-18: 1 mL via INTRAMUSCULAR

## 2012-07-18 MED ORDER — RABIES VACCINE, PCEC IM SUSR
INTRAMUSCULAR | Status: AC
  Filled 2012-07-18: qty 1

## 2012-07-18 NOTE — ED Notes (Signed)
Here for the 4th rabies vacc 

## 2012-07-28 ENCOUNTER — Other Ambulatory Visit: Payer: Self-pay | Admitting: Physician Assistant

## 2012-07-31 ENCOUNTER — Encounter: Payer: BC Managed Care – PPO | Admitting: Gynecology

## 2012-08-05 ENCOUNTER — Encounter: Payer: BC Managed Care – PPO | Admitting: Gynecology

## 2012-08-06 ENCOUNTER — Ambulatory Visit (INDEPENDENT_AMBULATORY_CARE_PROVIDER_SITE_OTHER): Payer: BC Managed Care – PPO | Admitting: Internal Medicine

## 2012-08-06 VITALS — BP 120/82 | HR 81 | Temp 98.3°F | Resp 18 | Ht 65.0 in | Wt 238.0 lb

## 2012-08-06 DIAGNOSIS — S96919A Strain of unspecified muscle and tendon at ankle and foot level, unspecified foot, initial encounter: Secondary | ICD-10-CM

## 2012-08-06 DIAGNOSIS — S93609A Unspecified sprain of unspecified foot, initial encounter: Secondary | ICD-10-CM

## 2012-08-06 DIAGNOSIS — R7309 Other abnormal glucose: Secondary | ICD-10-CM

## 2012-08-06 DIAGNOSIS — Z760 Encounter for issue of repeat prescription: Secondary | ICD-10-CM

## 2012-08-06 DIAGNOSIS — F429 Obsessive-compulsive disorder, unspecified: Secondary | ICD-10-CM

## 2012-08-06 DIAGNOSIS — IMO0002 Reserved for concepts with insufficient information to code with codable children: Secondary | ICD-10-CM

## 2012-08-06 DIAGNOSIS — G4733 Obstructive sleep apnea (adult) (pediatric): Secondary | ICD-10-CM

## 2012-08-06 DIAGNOSIS — R739 Hyperglycemia, unspecified: Secondary | ICD-10-CM

## 2012-08-06 DIAGNOSIS — Z0000 Encounter for general adult medical examination without abnormal findings: Secondary | ICD-10-CM

## 2012-08-06 DIAGNOSIS — F988 Other specified behavioral and emotional disorders with onset usually occurring in childhood and adolescence: Secondary | ICD-10-CM

## 2012-08-06 DIAGNOSIS — S43409A Unspecified sprain of unspecified shoulder joint, initial encounter: Secondary | ICD-10-CM

## 2012-08-06 DIAGNOSIS — I10 Essential (primary) hypertension: Secondary | ICD-10-CM

## 2012-08-06 LAB — LIPID PANEL
LDL Cholesterol: 169 mg/dL — ABNORMAL HIGH (ref 0–99)
Triglycerides: 112 mg/dL (ref <150)
VLDL: 22 mg/dL (ref 0–40)

## 2012-08-06 LAB — POCT CBC
HCT, POC: 43.9 % (ref 37.7–47.9)
Hemoglobin: 14 g/dL (ref 12.2–16.2)
Lymph, poc: 2.8 (ref 0.6–3.4)
MCH, POC: 30 pg (ref 27–31.2)
MCHC: 31.9 g/dL (ref 31.8–35.4)
MCV: 94.2 fL (ref 80–97)
MPV: 7.8 fL (ref 0–99.8)
POC MID %: 4.6 %M (ref 0–12)
WBC: 9.3 10*3/uL (ref 4.6–10.2)

## 2012-08-06 LAB — POCT UA - MICROSCOPIC ONLY
Bacteria, U Microscopic: NEGATIVE
Casts, Ur, LPF, POC: NEGATIVE
Crystals, Ur, HPF, POC: NEGATIVE
Mucus, UA: NEGATIVE

## 2012-08-06 LAB — POCT URINALYSIS DIPSTICK
Blood, UA: NEGATIVE
Protein, UA: NEGATIVE
Spec Grav, UA: 1.025
Urobilinogen, UA: 0.2
pH, UA: 5.5

## 2012-08-06 LAB — COMPREHENSIVE METABOLIC PANEL
CO2: 28 mEq/L (ref 19–32)
Glucose, Bld: 77 mg/dL (ref 70–99)
Sodium: 139 mEq/L (ref 135–145)
Total Bilirubin: 0.3 mg/dL (ref 0.3–1.2)
Total Protein: 7.2 g/dL (ref 6.0–8.3)

## 2012-08-06 LAB — POCT GLYCOSYLATED HEMOGLOBIN (HGB A1C): Hemoglobin A1C: 5.5

## 2012-08-06 MED ORDER — BUPROPION HCL ER (XL) 300 MG PO TB24: 300.0000 mg | ORAL_TABLET | Freq: Every day | ORAL | Status: DC

## 2012-08-06 MED ORDER — ALPRAZOLAM 1 MG PO TABS: 1.0000 mg | ORAL_TABLET | Freq: Every morning | ORAL | Status: DC

## 2012-08-06 MED ORDER — VALSARTAN-HYDROCHLOROTHIAZIDE 320-25 MG PO TABS: 1.0000 | ORAL_TABLET | Freq: Every day | ORAL | Status: DC

## 2012-08-06 NOTE — Progress Notes (Signed)
  Subjective:    Patient ID: [name], female    DOB: 1960-10-14, 52 y.o.   MRN: [id number]  HPI    Review of Systems     Objective:   Physical Exam        Assessment & Plan:

## 2012-08-06 NOTE — Progress Notes (Signed)
Subjective:    Patient ID: [name], female    DOB: 28-Jul-1960, 52 y.o.   MRN: [id number]  HPI Pt presents for complete physical. 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 Patient has had some increased ankle pain. States she has ganglion cysts bilateral ankles, states she is using braces. Sweedo braces have been very helpful. She does have some soreness of her left 5th metatarsal. She has history of stress fracture of this area in the past. Patient also has some right shoulder pain. This is increased with range of motion, especially abduction.   Her medications are continued to be monitored by Dr. Toni Arthurs. Patient states she is doing well with her ADD and OCD. She states the neuropathy is controlled on her gabapentin. Colonoscopy in May 2013 was normal. Mammogram scheduled for next week. Has family history of breast cancer, mother and maternal grandmother both had history of breast cancer. Patient had surgery Feb and March 2013 for squamous cell CA, vulva. She has recovered well and was told she will have about a 5% chance for recurrence.  ADD,OCD controlled well. Her children are well and all in school oldest daughter in pharmacy school, middle daughter is at [employer], youngest is also in college.  Review of Systems  Constitutional: Negative.   HENT: Negative.   Eyes: Negative.   Respiratory: Negative.   Cardiovascular: Negative.   Gastrointestinal: Negative.   Genitourinary: Negative.   Musculoskeletal: Positive for myalgias and arthralgias.  Skin: Negative.   Neurological: Negative.   Hematological: Negative.   Psychiatric/Behavioral: Negative.        Objective:   Physical Exam  Vitals reviewed. Constitutional: She is oriented to person, place, and time. She appears well-developed and well-nourished. No distress.  HENT:  Right Ear: External ear normal.  Left Ear: External  ear normal.  Nose: Nose normal.  Mouth/Throat: Oropharynx is clear and moist.  Eyes: Conjunctivae normal are normal. Pupils are equal, round, and reactive to light.  Neck: Normal range of motion. Neck supple. No tracheal deviation present. No thyromegaly present.  Cardiovascular: Normal rate, regular rhythm, normal heart sounds and intact distal pulses.   Pulmonary/Chest: Effort normal and breath sounds normal.  Abdominal: Bowel sounds are normal.  Musculoskeletal: She exhibits tenderness.       Right shoulder: She exhibits tenderness. She exhibits normal range of motion, no bony tenderness and normal strength.       Right foot: She exhibits tenderness and bony tenderness. She exhibits normal range of motion.       Feet:  Lymphadenopathy:    She has no cervical adenopathy.  Neurological: She is alert and oriented to person, place, and time. She has normal reflexes. No cranial nerve deficit. She exhibits normal muscle tone. Coordination normal.  Skin: Skin is warm and dry. No ecchymosis noted.  Psychiatric: She has a normal mood and affect. Her behavior is normal. Judgment and thought content normal.   Abdomen soft and non tender. Tenderness right 5th metatarsal. Pain with range of motion of right shoulder.   EKG     Assessment & Plan:  Encouraged patient to exercise and try to lose weight. Hopefully her stress will decrease with new job. Patient advised shoulder pain is likely rotator cuff strain. She is advised to stretch this area and try to strengthen when it begins to feel better. Encouraged to continue ROM exercises.  Right foot pain encouraged patient to  go into boot for one week, if this does not resolve pain she will return to clinic for xrays to rule out stress fracture.  Meds ordered this encounter  Medications  . valsartan-hydrochlorothiazide (DIOVAN-HCT) 320-25 MG per tablet    Sig: Take 1 tablet by mouth daily. Needs office visit    Dispense:  90 tablet    Refill:  4  .  buPROPion (WELLBUTRIN XL) 300 MG 24 hr tablet    Sig: Take 1 tablet (300 mg total) by mouth daily.    Dispense:  90 tablet    Refill:  4  . ALPRAZolam (XANAX) 1 MG tablet    Sig: Take 1 tablet (1 mg total) by mouth every morning. AND PRN    Dispense:  180 tablet    Refill:  2

## 2012-08-10 ENCOUNTER — Other Ambulatory Visit (HOSPITAL_COMMUNITY)
Admission: RE | Admit: 2012-08-10 | Discharge: 2012-08-10 | Disposition: A | Discharge status: Home / Self Care | Payer: BC Managed Care – PPO | Source: Ambulatory Visit | Attending: Gynecology | Admitting: Gynecology

## 2012-08-10 ENCOUNTER — Ambulatory Visit (INDEPENDENT_AMBULATORY_CARE_PROVIDER_SITE_OTHER): Payer: BC Managed Care – PPO | Admitting: Gynecology

## 2012-08-10 ENCOUNTER — Encounter: Payer: Self-pay | Admitting: Gynecology

## 2012-08-10 VITALS — BP 132/86 | Ht 65.5 in | Wt 237.0 lb

## 2012-08-10 DIAGNOSIS — Z01419 Encounter for gynecological examination (general) (routine) without abnormal findings: Secondary | ICD-10-CM

## 2012-08-10 DIAGNOSIS — Z1151 Encounter for screening for human papillomavirus (HPV): Secondary | ICD-10-CM | POA: Insufficient documentation

## 2012-08-10 NOTE — Patient Instructions (Addendum)
Remember to get your mammogram and Shingles vaccine. God Thomes Lolling!!!

## 2012-08-10 NOTE — Progress Notes (Signed)
[name] Jul 04, 1961 [id number]   History:    52 y.o.  for annual gyn exam . Patient is asymptomatic. Her PCP has recently done all her labs were monitored for hypertension. 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 Labium, biopsy, left crural area, lateral to left labia majora  - INVASIVE WELL DIFFERENTIATED SQUAMOUS CELL CARCINOMA.  - MARGINS CLEAR.  - PLEASE SEE ONCOLOGY TEMPLATE FOR DETAILS.  2. Labium, biopsy, small isolated pigmented area inferior and lateral to left labia majora  - BENIGN MELANOCYTIC NEVUS, COMPOUND TYPE, MARGINS NOT INVOLVED  Patient was then referred to GYN oncologist Dr. Cleda Mccreedy (GYN oncologist) who at [facility] performed A wide local excision in March of 2013 and a final pathology report was as follows: No residual invasive carcinoma identified. VIN II with HPV effect,VIN I with HPV effect. Margins free of high-grade dysplasia. She is being followed by her once a year and once a year with me.  Patient's mammogram was normal in 2012. Patient states she does her monthly self breast examination. Patient had a normal colonoscopy in 2013. Last Pap smear was normal in 2012. Patient is taking her calcium and vitamin D. Patient with past history of TVH.  Past medical history,surgical history, family history and social history were all reviewed and documented in the EPIC chart.  Gynecologic History No LMP recorded. Patient  has had a hysterectomy. Contraception: status post hysterectomy Last Pap: 2012. Results were: normal Last mammogram: 2012. Results were: normal  Obstetric History OB History    Grav Para Term Preterm Abortions TAB SAB Ect Mult Living   3 3 3       3      # Outc Date GA Lbr Len/2nd Wgt Sex Del Anes PTL Lv   1 TRM     F SVD  No Yes   2 TRM     F SVD   Yes   3 TRM     F SVD   Yes       ROS: A ROS was performed and pertinent positives and negatives are included in the history.  GENERAL: No fevers or chills. HEENT: No change in vision, no earache, sore throat or sinus congestion. NECK: No pain or stiffness. CARDIOVASCULAR: No chest pain or pressure. No palpitations. PULMONARY: No shortness of breath, cough or wheeze. GASTROINTESTINAL: No abdominal pain, nausea, vomiting or diarrhea, melena or bright red blood per rectum. GENITOURINARY: No urinary frequency, urgency, hesitancy or dysuria. MUSCULOSKELETAL: No joint or muscle pain, no back pain, no recent trauma. DERMATOLOGIC: No rash, no itching, no lesions. ENDOCRINE: No polyuria, polydipsia, no heat or cold intolerance. No recent change in weight. HEMATOLOGICAL: No anemia or easy bruising or bleeding. NEUROLOGIC: No headache, seizures, numbness, tingling or weakness. PSYCHIATRIC: No depression, no loss of interest in normal activity or change in sleep pattern.     Exam: chaperone present  BP 132/86  Ht 5' 5.5" (1.664 m)  Wt 237 lb (107.502 kg)  BMI 38.84 kg/m2  Body mass index is 38.84 kg/(m^2).  General appearance : Well developed well nourished female. No acute distress HEENT: Neck supple, trachea midline, no carotid bruits, no thyroidmegaly Lungs: Clear to auscultation, no rhonchi or wheezes, or rib retractions  Heart: Regular rate and rhythm, no murmurs or gallops Breast:Examined in sitting and supine position were symmetrical in appearance, no palpable masses or tenderness,  no skin retraction, no nipple inversion, no nipple  discharge, no skin discoloration, no axillary or supraclavicular lymphadenopathy Abdomen: no palpable masses or tenderness, no rebound or guarding Extremities: no edema or skin discoloration or tenderness  Pelvic:  Bartholin, Urethra, Skene Glands: Within normal limits             Vagina: No gross lesions or discharge  Cervix: Absent  Uterus absent  Adnexa  Without masses or tenderness  Anus and perineum  normal   Rectovaginal  normal sphincter tone without palpated masses or tenderness             Hemoccult not done colonoscopy less than 12 months ago.     Assessment/Plan:  53 y.o. female for annual exam with past history of invasive well differentiated adenocarcinoma of the left groin region with no evidence of recurrent disease after 2 wide local excisions. Patient will continue to followup with the GYN oncologist once a year and once a year with me. Despite the guidelines Pap smear was done today because of her above-mentioned history. She was reminded to schedule her mammogram and to continue to do her monthly self breast examination. No labs drawn today since her primary physician has drawn the recently. We discussed importance of calcium and vitamin D and regular exercise for osteoporosis prevention.    Ok Edwards MD, 4:58 PM 08/10/2012

## 2012-08-13 ENCOUNTER — Encounter: Payer: Self-pay | Admitting: Gynecology

## 2012-08-15 ENCOUNTER — Telehealth: Payer: Self-pay

## 2012-08-15 NOTE — Telephone Encounter (Signed)
ERROR

## 2012-08-18 ENCOUNTER — Ambulatory Visit (INDEPENDENT_AMBULATORY_CARE_PROVIDER_SITE_OTHER): Payer: BC Managed Care – PPO | Admitting: Family Medicine

## 2012-08-18 VITALS — BP 128/82 | HR 99 | Temp 98.7°F | Resp 17 | Ht 65.0 in | Wt 239.0 lb

## 2012-08-18 DIAGNOSIS — M79609 Pain in unspecified limb: Secondary | ICD-10-CM

## 2012-08-18 DIAGNOSIS — M7060 Trochanteric bursitis, unspecified hip: Secondary | ICD-10-CM

## 2012-08-18 DIAGNOSIS — M79605 Pain in left leg: Secondary | ICD-10-CM

## 2012-08-18 DIAGNOSIS — M76899 Other specified enthesopathies of unspecified lower limb, excluding foot: Secondary | ICD-10-CM

## 2012-08-18 NOTE — Patient Instructions (Addendum)
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
## 2012-08-18 NOTE — Progress Notes (Signed)
Greater trochanteric injection procedure. Consent obtained and timeout performed Patient lateral position with left hip up Point of maximal tenderness palpated and marked Area cleaned with alcohol and cold spray applied 22-gauge 3 inch spinal needle used to access the greater trochanteric space 4 mL of Marcaine and 40 mg of Depo-Medrol were injected into the greater trochanteric space and wheel fashion Patient tolerated the procedure well no bleeding

## 2012-10-26 ENCOUNTER — Encounter: Payer: Self-pay | Admitting: Gynecology

## 2012-11-02 ENCOUNTER — Other Ambulatory Visit: Payer: Self-pay | Admitting: Neurology

## 2012-11-13 ENCOUNTER — Other Ambulatory Visit: Payer: Self-pay | Admitting: Neurology

## 2012-11-13 ENCOUNTER — Telehealth: Payer: Self-pay | Admitting: Neurology

## 2012-11-13 DIAGNOSIS — G4710 Hypersomnia, unspecified: Secondary | ICD-10-CM

## 2012-11-13 MED ORDER — MODAFINIL 200 MG PO TABS
200.0000 mg | ORAL_TABLET | Freq: Every day | ORAL | Status: DC
Start: 2012-11-13 — End: 2013-01-22

## 2012-11-13 NOTE — Telephone Encounter (Signed)
Patient requesting change from nuvigil 250mg  to provigil(generic).

## 2012-11-13 NOTE — Telephone Encounter (Signed)
Patient is currently taking Nuvigil 250mg  once daily and would like her Rx changed to Provigil (generic).  Please advise.  Thank you.

## 2012-12-09 ENCOUNTER — Telehealth: Payer: Self-pay

## 2012-12-09 MED ORDER — VALSARTAN-HYDROCHLOROTHIAZIDE 320-25 MG PO TABS: 1.0000 | ORAL_TABLET | Freq: Every day | ORAL | Status: DC

## 2012-12-09 NOTE — Telephone Encounter (Signed)
[employer] [facility] requests RF of Alprazolam 1 mg (plain).

## 2012-12-11 NOTE — Telephone Encounter (Signed)
Ok rf one time only

## 2012-12-14 MED ORDER — ALPRAZOLAM 1 MG PO TABS: 1.0000 mg | ORAL_TABLET | Freq: Every morning | ORAL | Status: DC

## 2012-12-14 NOTE — Telephone Encounter (Signed)
done

## 2012-12-14 NOTE — Addendum Note (Signed)
Addended byCaffie Damme on: 12/14/2012 09:04 AM   Modules accepted: Orders

## 2012-12-14 NOTE — Telephone Encounter (Signed)
Called in.

## 2012-12-24 ENCOUNTER — Encounter: Payer: Self-pay | Admitting: Neurology

## 2012-12-24 ENCOUNTER — Ambulatory Visit: Payer: BC Managed Care – PPO | Admitting: Neurology

## 2013-01-21 ENCOUNTER — Encounter: Payer: Self-pay | Admitting: Neurology

## 2013-01-22 ENCOUNTER — Encounter: Payer: Self-pay | Admitting: Neurology

## 2013-01-22 ENCOUNTER — Ambulatory Visit (INDEPENDENT_AMBULATORY_CARE_PROVIDER_SITE_OTHER): Payer: Managed Care, Other (non HMO) | Admitting: Neurology

## 2013-01-22 VITALS — BP 140/89 | HR 87 | Resp 18 | Ht 65.0 in | Wt 240.0 lb

## 2013-01-22 DIAGNOSIS — F909 Attention-deficit hyperactivity disorder, unspecified type: Secondary | ICD-10-CM

## 2013-01-22 DIAGNOSIS — G4710 Hypersomnia, unspecified: Secondary | ICD-10-CM

## 2013-01-22 DIAGNOSIS — G4730 Sleep apnea, unspecified: Secondary | ICD-10-CM

## 2013-01-22 DIAGNOSIS — G4733 Obstructive sleep apnea (adult) (pediatric): Secondary | ICD-10-CM

## 2013-01-22 DIAGNOSIS — F901 Attention-deficit hyperactivity disorder, predominantly hyperactive type: Secondary | ICD-10-CM

## 2013-01-22 MED ORDER — MOMETASONE FUROATE 50 MCG/ACT NA SUSP: 2.0000 | Freq: Every day | NASAL | Status: DC

## 2013-01-22 MED ORDER — MODAFINIL 200 MG PO TABS: 200.0000 mg | ORAL_TABLET | Freq: Every day | ORAL | Status: DC
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
## 2013-01-22 NOTE — Patient Instructions (Signed)
Sleep Apnea Sleep apnea is disorder that affects a person's sleep. A person with sleep apnea has abnormal pauses in their breathing when they sleep. It is hard for them to get a good sleep. This makes a person tired during the day. It also can lead to other physical problems. There are three types of sleep apnea. One type is when breathing stops for a short time because your airway is blocked (obstructive sleep apnea). Another type is when the brain sometimes fails to give the normal signal to breathe to the muscles that control your breathing (central sleep apnea). The third type is a combination of the other two types. HOME CARE  Do not sleep on your back. Try to sleep on your side.  Take all medicine as told by your doctor.  Avoid alcohol, calming medicines (sedatives), and depressant drugs.  Try to lose weight if you are overweight. Talk to your doctor about a healthy weight goal. Your doctor may have you use a device that helps to open your airway. It can help you get the air that you need. It is called a positive airway pressure (PAP) device. There are three types of PAP devices:  Continuous positive airway pressure (CPAP) device.  Nasal expiratory positive airway pressure (EPAP) device.  Bilevel positive airway pressure (BPAP) device. MAKE SURE YOU:  Understand these instructions.  Will watch your condition.  Will get help right away if you are not doing well or get worse. Document Released: 04/02/2008 Document Revised: 06/10/2012 Document Reviewed: 10/26/2011 ExitCare Patient Information 2014 [employer]. CPAP and BIPAP CPAP and BIPAP are methods of helping you breathe. CPAP stands for "continuous positive airway pressure." BIPAP stands for "bi-level positive airway pressure." Both CPAP and BIPAP are provided by a small machine with a flexible plastic tube that attaches to a plastic mask that goes over your nose or mouth. Air is blown into your air passages through your nose  or mouth. This helps to keep your airways open and helps to keep you breathing well. The amount of pressure that is used to blow the air into your air passages can be set on the machine. The pressure setting is based on your needs. With CPAP, the amount of pressure stays the same while you breathe in and out. With BIPAP, the amount of pressure changes when you inhale and exhale. Your caregiver will recommend whether CPAP or BIPAP would be more helpful for you.  CPAP and BIPAP can be helpful for both adults and children with:  Sleep apnea.  Chronic Obstructive Pulmonary Disease (COPD), a condition like emphysema.  Diseases which weaken the muscles of the chest such as muscular dystrophy or neurological diseases.  Other problems that cause breathing to be weak or difficult. USE OF CPAP OR BIPAP The respiratory therapist or technician will help you get used to wearing the mask. Some people feel claustrophobic (a trapped or closed in feeling) at first, because the mask needs to be fairly snug on your face.   It may help you to get used to the mask gradually, by first holding the mask loosely over your nose or mouth using a low pressure setting on the machine. Gradually the mask can be applied more snugly with increased pressure. You can also gradually increase the amount of time the mask is used.  People with sleep apnea will use the mask and machine at night when they are sleeping. Others, like those with ALS or other breathing difficulties, may need the CPAP or   BIPAP all the time.  If the first mask you try does not fit well, or is uncomfortable, there are other types and sizes that can be tried.  If you tend to breathe through your mouth, a chin strap may be applied to help keep your mouth closed (if you are using a nasal mask).  The CPAP and BIPAP machines have alarms that may sound if the mask comes off or develops a leak.  You should not eat or drink while the CPAP or BIPAP is on. Food or  fluids could get pushed into your lungs by the pressure of the CPAP or BIPAP. Sometimes CPAP or BIPAP machines are ordered for home use. If you are going to use the CPAP or BIPAP machine at home, follow these instructions  CPAP or BIPAP machines can be rented or purchased through home health care companies. There are many different brands of machines available. If you rent a machine before purchasing you may find which particular machine works well for you.  Ask questions if there is something you do not understand when picking out your machine.  Place your CPAP or BIPAP machine on a secure table or stand near an electrical outlet.  Know where the On/Off switch is.  Follow your doctor's instructions for how to set the pressure on your machine and when you should use it.  Do not smoke! Tobacco smoke residue can damage the machine. SEEK IMMEDIATE MEDICAL CARE IF:   You have redness or open areas around your nose or mouth.  You have trouble operating the CPAP or BIPAP machine.  You cannot tolerate wearing the CPAP or BIPAP mask.  You have any questions or concerns. Document Released: 03/22/2004 Document Revised: 09/16/2011 Document Reviewed: 06/21/2008 ExitCare Patient Information 2014 [employer]. Exercise to Lose Weight Exercise and a healthy diet may help you lose weight. Your doctor may suggest specific exercises. EXERCISE IDEAS AND TIPS  Choose low-cost things you enjoy doing, such as walking, bicycling, or exercising to workout videos.  Take stairs instead of the elevator.  Walk during your lunch break.  Park your car further away from work or school.  Go to a gym or an exercise class.  Start with 5 to 10 minutes of exercise each day. Build up to 30 minutes of exercise 4 to 6 days a week.  Wear shoes with good support and comfortable clothes.  Stretch before and after working out.  Work out until you breathe harder and your heart beats faster.  Drink extra water  when you exercise.  Do not do so much that you hurt yourself, feel dizzy, or get very short of breath. Exercises that burn about 150 calories:  Running 1  miles in 15 minutes.  Playing volleyball for 45 to 60 minutes.  Washing and waxing a car for 45 to 60 minutes.  Playing touch football for 45 minutes.  Walking 1  miles in 35 minutes.  Pushing a stroller 1  miles in 30 minutes.  Playing basketball for 30 minutes.  Raking leaves for 30 minutes.  Bicycling 5 miles in 30 minutes.  Walking 2 miles in 30 minutes.  Dancing for 30 minutes.  Shoveling snow for 15 minutes.  Swimming laps for 20 minutes.  Walking up stairs for 15 minutes.  Bicycling 4 miles in 15 minutes.  Gardening for 30 to 45 minutes.  Jumping rope for 15 minutes.  Washing windows or floors for 45 to 60 minutes. Document Released: 07/27/2010 Document Revised: 09/16/2011 Document   Reviewed: 07/27/2010 ExitCare Patient Information 2014 [employer].  

## 2013-01-26 ENCOUNTER — Encounter: Payer: Self-pay | Admitting: Neurology

## 2013-02-02 ENCOUNTER — Telehealth: Payer: Self-pay

## 2013-02-02 ENCOUNTER — Other Ambulatory Visit: Payer: Self-pay | Admitting: Neurology

## 2013-02-02 DIAGNOSIS — J309 Allergic rhinitis, unspecified: Secondary | ICD-10-CM

## 2013-02-02 MED ORDER — FLUTICASONE PROPIONATE 50 MCG/ACT NA SUSP: 2.0000 | Freq: Every day | NASAL | Status: DC

## 2013-02-02 NOTE — Telephone Encounter (Signed)
[employer] sent [place] a fax saying the patients insurance will not cover Nasonex.  They would like a new x prescribed for a formulary alternative.  Those medications include: Fluticasone, Flunisolide or Triamcinolone Acetonide nasal sprays.  Would you like to change Rx?  Please advise.  Thank you.

## 2013-02-26 ENCOUNTER — Other Ambulatory Visit: Payer: Self-pay | Admitting: Endodontics

## 2013-03-22 ENCOUNTER — Other Ambulatory Visit: Payer: Self-pay

## 2013-03-22 ENCOUNTER — Encounter: Payer: Self-pay | Admitting: Neurology

## 2013-03-22 DIAGNOSIS — G4710 Hypersomnia, unspecified: Secondary | ICD-10-CM

## 2013-03-22 DIAGNOSIS — F901 Attention-deficit hyperactivity disorder, predominantly hyperactive type: Secondary | ICD-10-CM

## 2013-03-22 DIAGNOSIS — G4733 Obstructive sleep apnea (adult) (pediatric): Secondary | ICD-10-CM

## 2013-03-22 MED ORDER — MODAFINIL 200 MG PO TABS: 200.0000 mg | ORAL_TABLET | Freq: Every day | ORAL | Status: DC

## 2013-03-22 NOTE — Addendum Note (Signed)
Addended by: Melvyn Novas on: 03/22/2013 04:25 PM   Modules accepted: Orders

## 2013-03-22 NOTE — Telephone Encounter (Signed)
Patient called stating she would like a new Rx for Provigil.  Says she has to switch pharmacies and they will not allow her to transfer the Rx.  She would like to pick up the prescription when it's ready.  Call back number [phone].  Says she has a $6,000 deductable she has to meet on her ins plan, so she has to go to the pharmacy that is least expensive.

## 2013-03-23 NOTE — Telephone Encounter (Signed)
Rx signed, ready for pick up.  I called the patient.  Got no answer, left message.  

## 2013-03-31 ENCOUNTER — Telehealth: Payer: Self-pay

## 2013-03-31 NOTE — Telephone Encounter (Signed)
Patient called, left message requesting that her Rx be mailed to her home address since she is not able to come to the office and pick it up.  Rx and info on Rx Outreach has been mailed.

## 2013-04-16 ENCOUNTER — Telehealth: Payer: Self-pay

## 2013-04-16 NOTE — Telephone Encounter (Signed)
[employer] requests Rf of alprazolam 1 mg.

## 2013-04-18 ENCOUNTER — Ambulatory Visit: Payer: Managed Care, Other (non HMO) | Admitting: Internal Medicine

## 2013-04-18 VITALS — BP 126/80 | HR 93 | Temp 98.0°F | Resp 17 | Ht 65.5 in | Wt 235.0 lb

## 2013-04-18 DIAGNOSIS — M25569 Pain in unspecified knee: Secondary | ICD-10-CM

## 2013-04-18 DIAGNOSIS — L739 Follicular disorder, unspecified: Secondary | ICD-10-CM

## 2013-04-18 DIAGNOSIS — M7062 Trochanteric bursitis, left hip: Secondary | ICD-10-CM

## 2013-04-18 DIAGNOSIS — M7061 Trochanteric bursitis, right hip: Secondary | ICD-10-CM

## 2013-04-18 DIAGNOSIS — M76899 Other specified enthesopathies of unspecified lower limb, excluding foot: Secondary | ICD-10-CM

## 2013-04-18 DIAGNOSIS — F39 Unspecified mood [affective] disorder: Secondary | ICD-10-CM

## 2013-04-18 MED ORDER — ALPRAZOLAM 1 MG PO TABS: 1.0000 mg | ORAL_TABLET | Freq: Every morning | ORAL | Status: DC

## 2013-04-18 MED ORDER — MUPIROCIN 2 % EX OINT: TOPICAL_OINTMENT | Freq: Three times a day (TID) | CUTANEOUS | Status: DC
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
## 2013-04-18 NOTE — Patient Instructions (Signed)
[facility] [phone]

## 2013-04-18 NOTE — Progress Notes (Signed)
Procedures x 2  Greater trochanteric injection procedure for R hip: Consent obtained and timeout performed  Patient lateral position with R hip up  Point of maximal tenderness palpated and marked  Area cleaned with prov. and cold spray applied  20-gauge 3.5 inch spinal needle used to access the greater trochanteric space  2 mL of lidocaine and 40 mg of kenalog were injected into the greater trochanteric space and massaged  Patient tolerated the procedure well no bleeding  This was repeated on the Left with same procedure and outcome  RPD

## 2013-04-22 ENCOUNTER — Encounter: Payer: Self-pay | Admitting: Neurology

## 2013-04-22 NOTE — Telephone Encounter (Signed)
This was RFd on 04/18/13.

## 2013-06-15 ENCOUNTER — Telehealth: Payer: Self-pay

## 2013-06-15 ENCOUNTER — Other Ambulatory Visit: Payer: Self-pay | Admitting: Internal Medicine

## 2013-06-15 MED ORDER — VALSARTAN-HYDROCHLOROTHIAZIDE 320-25 MG PO TABS: 1.0000 | ORAL_TABLET | Freq: Every day | ORAL | Status: DC

## 2013-06-15 MED ORDER — GABAPENTIN 300 MG PO CAPS: 300.0000 mg | ORAL_CAPSULE | ORAL | Status: DC

## 2013-06-15 NOTE — Telephone Encounter (Signed)
PT STATES SHE IS MOVING TO [place] BUT NEED TO GET HER BP MEDICINE REFILLED ALONG WITH ANOTHER ONE UNTIL SHE IS ABLE TO GET ANOTHER DR. PLEASE CALL [phone] OR [phone]    [employer] MAIL ORDER AND SHE SAID WE HAVE ON FILE

## 2013-06-15 NOTE — Telephone Encounter (Signed)
Sent to pharmacy 

## 2013-06-15 NOTE — Telephone Encounter (Signed)
What meds does she need? Diovan sent in, patient has also requested Gabapentin 300 tid. Pended please advise. She indicates she last filled this in 2012, and had a large supply then, she takes for her neuropathy, it helps, she is almost out.

## 2013-07-12 ENCOUNTER — Ambulatory Visit (INDEPENDENT_AMBULATORY_CARE_PROVIDER_SITE_OTHER): Payer: Managed Care, Other (non HMO) | Admitting: Internal Medicine

## 2013-07-12 VITALS — BP 122/88 | HR 94 | Temp 98.0°F | Resp 18 | Ht 65.0 in | Wt 236.0 lb

## 2013-07-12 DIAGNOSIS — Z0000 Encounter for general adult medical examination without abnormal findings: Secondary | ICD-10-CM

## 2013-07-12 DIAGNOSIS — E785 Hyperlipidemia, unspecified: Secondary | ICD-10-CM

## 2013-07-12 DIAGNOSIS — F4310 Post-traumatic stress disorder, unspecified: Secondary | ICD-10-CM

## 2013-07-12 DIAGNOSIS — G609 Hereditary and idiopathic neuropathy, unspecified: Secondary | ICD-10-CM

## 2013-07-12 DIAGNOSIS — I10 Essential (primary) hypertension: Secondary | ICD-10-CM

## 2013-07-12 DIAGNOSIS — F0630 Mood disorder due to known physiological condition, unspecified: Secondary | ICD-10-CM

## 2013-07-12 LAB — LIPID PANEL
CHOL/HDL RATIO: 3.6 ratio
Cholesterol: 260 mg/dL — ABNORMAL HIGH (ref 0–200)
HDL: 72 mg/dL (ref >39)
LDL CALC: 160 mg/dL — AB (ref 0–99)
Triglycerides: 141 mg/dL (ref <150)
VLDL: 28 mg/dL (ref 0–40)

## 2013-07-12 LAB — POCT URINALYSIS DIPSTICK
BILIRUBIN UA: NEGATIVE
Blood, UA: NEGATIVE
Glucose, UA: NEGATIVE
Ketones, UA: NEGATIVE
LEUKOCYTES UA: NEGATIVE
Nitrite, UA: NEGATIVE
PH UA: 6.5
Protein, UA: NEGATIVE
Spec Grav, UA: <=1.005
Urobilinogen, UA: 0.2

## 2013-07-12 LAB — POCT CBC
GRANULOCYTE PERCENT: 66.7 % (ref 37–80)
HCT, POC: 44.6 % (ref 37.7–47.9)
Hemoglobin: 14.1 g/dL (ref 12.2–16.2)
Lymph, poc: 2.9 (ref 0.6–3.4)
MCH: 30.2 pg (ref 27–31.2)
MCHC: 31.6 g/dL — AB (ref 31.8–35.4)
MCV: 95.5 fL (ref 80–97)
MID (CBC): 0.6 (ref 0–0.9)
MPV: 8.6 fL (ref 0–99.8)
PLATELET COUNT, POC: 387 10*3/uL (ref 142–424)
POC Granulocyte: 7 — AB (ref 2–6.9)
POC LYMPH %: 27.9 % (ref 10–50)
POC MID %: 5.4 % (ref 0–12)
RBC: 4.67 M/uL (ref 4.04–5.48)
RDW, POC: 13.1 %
WBC: 10.5 10*3/uL — AB (ref 4.6–10.2)

## 2013-07-12 LAB — COMPREHENSIVE METABOLIC PANEL
ALK PHOS: 47 U/L (ref 39–117)
ALT: 22 U/L (ref 0–35)
AST: 21 U/L (ref 0–37)
Albumin: 4.3 g/dL (ref 3.5–5.2)
BUN: 12 mg/dL (ref 6–23)
CO2: 31 mEq/L (ref 19–32)
CREATININE: 0.71 mg/dL (ref 0.50–1.10)
Calcium: 9.7 mg/dL (ref 8.4–10.5)
Chloride: 99 mEq/L (ref 96–112)
Glucose, Bld: 82 mg/dL (ref 70–99)
Potassium: 4.1 mEq/L (ref 3.5–5.3)
Sodium: 137 mEq/L (ref 135–145)
Total Bilirubin: 0.5 mg/dL (ref 0.3–1.2)
Total Protein: 7.5 g/dL (ref 6.0–8.3)

## 2013-07-12 LAB — POCT UA - MICROSCOPIC ONLY
Bacteria, U Microscopic: NEGATIVE
Casts, Ur, LPF, POC: NEGATIVE
Crystals, Ur, HPF, POC: NEGATIVE
Mucus, UA: NEGATIVE
RBC, urine, microscopic: NEGATIVE
WBC, UR, HPF, POC: NEGATIVE
YEAST UA: NEGATIVE

## 2013-07-12 MED ORDER — ALPRAZOLAM 1 MG PO TABS: 1.0000 mg | ORAL_TABLET | Freq: Two times a day (BID) | ORAL | Status: DC | PRN

## 2013-07-12 MED ORDER — VALSARTAN-HYDROCHLOROTHIAZIDE 320-25 MG PO TABS: 1.0000 | ORAL_TABLET | Freq: Every day | ORAL | Status: AC

## 2013-07-12 MED ORDER — GABAPENTIN 300 MG PO CAPS: 300.0000 mg | ORAL_CAPSULE | Freq: Three times a day (TID) | ORAL | Status: DC

## 2013-07-12 NOTE — Patient Instructions (Signed)
Stress Stress-related medical problems are becoming increasingly common. The body has a built-in physical response to stressful situations. Faced with pressure, challenge or danger, we need to react quickly. Our bodies release hormones such as cortisol and adrenaline to help do this. These hormones are part of the "fight or flight" response and affect the metabolic rate, heart rate and blood pressure, resulting in a heightened, stressed state that prepares the body for optimum performance in dealing with a stressful situation. It is likely that early man required these mechanisms to stay alive, but usually modern stresses do not call for this, and the same hormones released in today's world can damage health and reduce coping ability. CAUSES  Pressure to perform at work, at school or in sports.  Threats of physical violence.  Money worries.  Arguments.  Family conflicts.  Divorce or separation from significant other.  Bereavement.  New job or unemployment.  Changes in location.  Alcohol or drug abuse. SOMETIMES, THERE IS NO PARTICULAR REASON FOR DEVELOPING STRESS. Almost all people are at risk of being stressed at some time in their lives. It is important to know that some stress is temporary and some is long term.  Temporary stress will go away when a situation is resolved. Most people can cope with short periods of stress, and it can often be relieved by relaxing, taking a walk, chatting through issues with friends, or having a good night's sleep.  Chronic (long-term, continuous) stress is much harder to deal with. It can be psychologically and emotionally damaging. It can be harmful both for an individual and for friends and family. SYMPTOMS Everyone reacts to stress differently. There are some common effects that help [place] recognize it. In times of extreme stress, people may:  Shake uncontrollably.  Breathe faster and deeper than normal (hyperventilate).  Vomit.  For people  with asthma, stress can trigger an attack.  For some people, stress may trigger migraine headaches, ulcers, and body pain. PHYSICAL EFFECTS OF STRESS MAY INCLUDE:  Loss of energy.  Skin problems.  Aches and pains resulting from tense muscles, including neck ache, backache and tension headaches.  Increased pain from arthritis and other conditions.  Irregular heart beat (palpitations).  Periods of irritability or anger.  Apathy or depression.  Anxiety (feeling uptight or worrying).  Unusual behavior.  Loss of appetite.  Comfort eating.  Lack of concentration.  Loss of, or decreased, sex-drive.  Increased smoking, drinking, or recreational drug use.  For women, missed periods.  Ulcers, joint pain, and muscle pain. Post-traumatic stress is the stress caused by any serious accident, strong emotional damage, or extremely difficult or violent experience such as rape or war. Post-traumatic stress victims can experience mixtures of emotions such as fear, shame, depression, guilt or anger. It may include recurrent memories or images that may be haunting. These feelings can last for weeks, months or even years after the traumatic event that triggered them. Specialized treatment, possibly with medicines and psychological therapies, is available. If stress is causing physical symptoms, severe distress or making it difficult for you to function as normal, it is worth seeing your caregiver. It is important to remember that although stress is a usual part of life, extreme or prolonged stress can lead to other illnesses that will need treatment. It is better to visit a doctor sooner rather than later. Stress has been linked to the development of high blood pressure and heart disease, as well as insomnia and depression. There is no diagnostic test for  stress since everyone reacts to it differently. But a caregiver will be able to spot the physical symptoms, such  as:  Headaches.  Shingles.  Ulcers. Emotional distress such as intense worry, low mood or irritability should be detected when the doctor asks pertinent questions to identify any underlying problems that might be the cause. In case there are physical reasons for the symptoms, the doctor may also want to do some tests to exclude certain conditions. If you feel that you are suffering from stress, try to identify the aspects of your life that are causing it. Sometimes you may not be able to change or avoid them, but even a small change can have a positive ripple effect. A simple lifestyle change can make all the difference. STRATEGIES THAT CAN HELP DEAL WITH STRESS:  Delegating or sharing responsibilities.  Avoiding confrontations.  Learning to be more assertive.  Regular exercise.  Avoid using alcohol or street drugs to cope.  Eating a healthy, balanced diet, rich in fruit and vegetables and proteins.  Finding humor or absurdity in stressful situations.  Never taking on more than you know you can handle comfortably.  Organizing your time better to get as much done as possible.  Talking to friends or family and sharing your thoughts and fears.  Listening to music or relaxation tapes.  Tensing and then relaxing your muscles, starting at the toes and working up to the head and neck. If you think that you would benefit from help, either in identifying the things that are causing your stress or in learning techniques to help you relax, see a caregiver who is capable of helping you with this. Rather than relying on medications, it is usually better to try and identify the things in your life that are causing stress and try to deal with them. There are many techniques of managing stress including counseling, psychotherapy, aromatherapy, yoga, and exercise. Your caregiver can help you determine what is best for you. Document Released: 09/14/2002 Document Revised: 09/16/2011 Document  Reviewed: 08/11/2007 [facility] Patient Information 2014 [place], [place]. Immunization Schedule, Adult  Influenza vaccine.  All adults should be immunized every year.  All adults, including pregnant women and people with hives-only allergy to eggs can receive the inactivated influenza (IIV) vaccine.  Adults aged 69 49 years can receive the recombinant influenza (RIV) vaccine. The RIV vaccine does not contain any egg protein.  Adults aged 41 years or older can receive the standard-dose IIV or the high-dose IIV.  Tetanus, diphtheria, and acellular pertussis (Td, Tdap) vaccine.  Pregnant women should receive 1 dose of Tdap vaccine during each pregnancy. The dose should be obtained regardless of the length of time since the last dose. Immunization is preferred during the 27th to 36th week of gestation.  An adult who has not previously received Tdap or who does not know his or her vaccine status should receive 1 dose of Tdap. This initial dose should be followed by tetanus and diphtheria toxoids (Td) booster doses every 10 years.  Adults with an unknown or incomplete history of completing a 3-dose immunization series with Td-containing vaccines should begin or complete a primary immunization series including a Tdap dose.  Adults should receive a Td booster every 10 years.  Varicella vaccine.  An adult without evidence of immunity to varicella should receive 2 doses or a second dose if he or she has previously received 1 dose.  Pregnant females who do not have evidence of immunity should receive the first dose after pregnancy.  This first dose should be obtained before leaving the health care facility. The second dose should be obtained 4 8 weeks after the first dose.  Human papillomavirus (HPV) vaccine.  Females aged 69 26 years who have not received the vaccine previously should obtain the 3-dose series.  The vaccine is not recommended for use in pregnant females. However, pregnancy  testing is not needed before receiving a dose. If a female is found to be pregnant after receiving a dose, no treatment is needed. In that case, the remaining doses should be delayed until after the pregnancy.  Males aged 82 21 years who have not received the vaccine previously should receive the 3-dose series. Males aged 19 26 years may be immunized.  Immunization is recommended through the age of 35 years for any female who has sex with males and did not get any or all doses earlier.  Immunization is recommended for any person with an immunocompromised condition through the age of 41 years if he or she did not get any or all doses earlier.  During the 3-dose series, the second dose should be obtained 4 8 weeks after the first dose. The third dose should be obtained 24 weeks after the first dose and 16 weeks after the second dose.  Zoster vaccine.  One dose is recommended for adults aged 69 years or older unless certain conditions are present.  Measles, mumps, and rubella (MMR) vaccine.  Adults born before 65 generally are considered immune to measles and mumps.  Adults born in [birth year] or later should have 1 or more doses of MMR vaccine unless there is a contraindication to the vaccine or there is laboratory evidence of immunity to each of the three diseases.  A routine second dose of MMR vaccine should be obtained at least 28 days after the first dose for students attending postsecondary schools, health care workers, or international travelers.  People who received inactivated measles vaccine or an unknown type of measles vaccine during 1963 1967 should receive 2 doses of MMR vaccine.  People who received inactivated mumps vaccine or an unknown type of mumps vaccine before 1979 and are at high risk for mumps infection should consider immunization with 2 doses of MMR vaccine.  For females of childbearing age, rubella immunity should be determined. If there is no evidence of immunity, females  who are not pregnant should be vaccinated. If there is no evidence of immunity, females who are pregnant should delay immunization until after pregnancy.  Unvaccinated health care workers born before [birth year] who lack laboratory evidence of measles, mumps, or rubella immunity or laboratory confirmation of disease should consider measles and mumps immunization with 2 doses of MMR vaccine or rubella immunization with 1 dose of MMR vaccine.  Pneumococcal 13-valent conjugate (PCV13) vaccine.  When indicated, a person who is uncertain of his or her immunization history and has no record of immunization should receive the PCV13 vaccine.  An adult aged 16 years or older who has certain medical conditions and has not been previously immunized should receive 1 dose of PCV13 vaccine. This PCV13 should be followed with a dose of pneumococcal polysaccharide (PPSV23) vaccine. The PPSV23 vaccine dose should be obtained at least 8 weeks after the dose of PCV13 vaccine.  An adult aged 26 years or older who has certain medical conditions and previously received 1 or more doses of PPSV23 vaccine should receive 1 dose of PCV13. The PCV13 vaccine dose should be obtained 1 or more years after the last  PPSV23 vaccine dose.  Pneumococcal polysaccharide (PPSV23) vaccine.  When PCV13 is also indicated, PCV13 should be obtained first.  All adults aged 86 years and older should be immunized.  An adult younger than age 30 years who has certain medical conditions should be immunized.  Any person who resides in a nursing home or long-term care facility should be immunized.  An adult smoker should be immunized.  People with an immunocompromised condition and certain other conditions should receive both PCV13 and PPSV23 vaccines.  People with human immunodeficiency virus (HIV) infection should be immunized as soon as possible after diagnosis.  Immunization during chemotherapy or radiation therapy should be  avoided.  Routine use of PPSV23 vaccine is not recommended for [place] Indians, [place], or people younger than 65 years unless there are medical conditions that require PPSV23 vaccine.  When indicated, people who have unknown immunization and have no record of immunization should receive PPSV23 vaccine.  One-time revaccination 5 years after the first dose of PPSV23 is recommended for people aged 37 64 years who have chronic kidney failure, nephrotic syndrome, asplenia, or immunocompromised conditions.  People who received 1 2 doses of PPSV23 before age 66 years should receive another dose of PPSV23 vaccine at age 42 years or later if at least 5 years have passed since the previous dose.  Doses of PPSV23 are not needed for people immunized with PPSV23 at or after age 62 years.  Meningococcal vaccine.  Adults with asplenia or persistent complement component deficiencies should receive 2 doses of quadrivalent meningococcal conjugate (MenACWY-D) vaccine. The doses should be obtained at least 2 months apart.  Microbiologists working with certain meningococcal bacteria, [employer] recruits, people at risk during an outbreak, and people who travel to or live in countries with a high rate of meningitis should be immunized.  A first-year college student up through age 45 years who is living in a residence hall should receive a dose if he or she did not receive a dose on or after his or her 16th birthday.  Adults who have certain high-risk conditions should receive one or more doses of vaccine.  Hepatitis A vaccine.  Adults who wish to be protected from this disease, have certain high-risk conditions, work with hepatitis A-infected animals, work in hepatitis A research labs, or travel to or work in countries with a high rate of hepatitis A should be immunized.  Adults who were previously unvaccinated and who anticipate close contact with an international adoptee during the first 60 days after  arrival in [place] from a country with a high rate of hepatitis A should be immunized.  Hepatitis B vaccine.  Adults who wish to be protected from this disease, have certain high-risk conditions, may be exposed to blood or other infectious body fluids, are household contacts or sex partners of hepatitis B positive people, are clients or workers in certain care facilities, or travel to or work in countries with a high rate of hepatitis B should be immunized.  Haemophilus influenzae type b (Hib) vaccine.  A previously unvaccinated person with asplenia or sickle cell disease or having a scheduled splenectomy should receive 1 dose of Hib vaccine.  Regardless of previous immunization, a recipient of a hematopoietic stem cell transplant should receive a 3-dose series 6 12 months after his or her successful transplant.  Hib vaccine is not recommended for adults with HIV infection. Document Released: 09/14/2003 Document Revised: 10/19/2012 Document Reviewed: 08/11/2012 [facility] Patient Information 2014 [place], [place].

## 2013-07-12 NOTE — Progress Notes (Signed)
   Subjective:    Patient ID: [name], female    DOB: [birth year], 53 y.o.   MRN: [id number]  HPI    Review of Systems     Objective:   Physical Exam        Assessment & Plan:

## 2013-07-12 NOTE — Progress Notes (Signed)
Subjective:    Patient ID: [name], female    DOB: March 20, 1961, 53 y.o.   MRN: [id number]  HPI  53 YO female here today for a physical exam. Pt has moved away from the area but came into town for this exam. She has begun an exercise program and has been running.   Pt saw her dentist today.  She will see the eye doctor in Feb 2015 She has a OBGYN appt in Feb 2015 and will have her pap done then.  Colonoscopy was done last year. Unremarkable results.  Medications have been updated. She has been doing well on her prescription medications.  Pt is seeing a vascular specialist for some bilateral leg swelling. She is awaiting insurance authorizations to have sx. She is currently wearing compression stockings.  She will need refills on her medications today.    Review of Systems  Constitutional: Negative.   HENT: Negative.   Eyes: Negative.   Respiratory: Negative.   Cardiovascular: Positive for leg swelling.  Gastrointestinal: Negative.   Endocrine: Negative.   Genitourinary: Negative.   Allergic/Immunologic: Negative.   Neurological: Negative.   Hematological: Negative.   Psychiatric/Behavioral: Negative.   Joint pains Mood disorder controlled     Objective:   Physical Exam  Vitals reviewed. Constitutional: She is oriented to person, place, and time. She appears well-developed and well-nourished. No distress.  HENT:  Right Ear: External ear normal.  Left Ear: External ear normal.  Nose: Nose normal.  Mouth/Throat: Oropharynx is clear and moist.  Eyes: Conjunctivae and EOM are normal. Pupils are equal, round, and reactive to light.  Neck: Normal range of motion. Neck supple. No tracheal deviation present. No thyromegaly present.  Cardiovascular: Normal rate, normal heart sounds and intact distal pulses.   Pulmonary/Chest: Effort normal and breath sounds normal.  Abdominal: Soft. Bowel sounds are normal. She exhibits no mass. There is no tenderness.    Musculoskeletal: Normal range of motion. She exhibits tenderness. She exhibits no edema.  Lymphadenopathy:    She has no cervical adenopathy.  Neurological: She is alert and oriented to person, place, and time. She has normal reflexes. No cranial nerve deficit. She exhibits normal muscle tone. Coordination normal.  Skin: No rash noted.  Psychiatric: She has a normal mood and affect. Her behavior is normal. Judgment and thought content normal.      Results for orders placed in visit on 07/12/13  POCT CBC      Result Value Range   WBC 10.5 (*) 4.6 - 10.2 K/uL   Lymph, poc 2.9  0.6 - 3.4   POC LYMPH PERCENT 27.9  10 - 50 %L   MID (cbc) 0.6  0 - 0.9   POC MID % 5.4  0 - 12 %M   POC Granulocyte 7.0 (*) 2 - 6.9   Granulocyte percent 66.7  37 - 80 %G   RBC 4.67  4.04 - 5.48 M/uL   Hemoglobin 14.1  12.2 - 16.2 g/dL   HCT, POC 44.6  37.7 - 47.9 %   MCV 95.5  80 - 97 fL   MCH, POC 30.2  27 - 31.2 pg   MCHC 31.6 (*) 31.8 - 35.4 g/dL   RDW, POC 13.1     Platelet Count, POC 387  142 - 424 K/uL   MPV 8.6  0 - 99.8 fL  POCT UA - MICROSCOPIC ONLY      Result Value Range   WBC, Ur, HPF, POC neg  RBC, urine, microscopic neg     Bacteria, U Microscopic neg     Mucus, UA neg     Epithelial cells, urine per micros 0-1     Crystals, Ur, HPF, POC neg     Casts, Ur, LPF, POC neg     Yeast, UA neg    POCT URINALYSIS DIPSTICK      Result Value Range   Color, UA light yellow     Clarity, UA clear     Glucose, UA neg     Bilirubin, UA neg     Ketones, UA neg     Spec Grav, UA <=1.005     Blood, UA neg     pH, UA 6.5     Protein, UA neg     Urobilinogen, UA 0.2     Nitrite, UA neg     Leukocytes, UA Negative     EKG normal    Assessment & Plan:  Healthy CPE HTN/Neuropathy/Stress and Mood disorder  All controlled

## 2013-07-13 ENCOUNTER — Telehealth: Payer: Self-pay

## 2013-07-13 LAB — TSH: TSH: 1.822 u[IU]/mL (ref 0.350–4.500)

## 2013-07-13 MED ORDER — AZITHROMYCIN 250 MG PO TABS: ORAL_TABLET | ORAL | Status: DC

## 2013-07-13 NOTE — Telephone Encounter (Signed)
  We saw her yesterday. She did mention that she had a little nasal congestion she was using allergy medication to treat. We did not address this during her visit however.   Would you suggest sending her in an antibiotic for this now or should she come back in to be evaluated.

## 2013-07-13 NOTE — Telephone Encounter (Signed)
Patient states that she was seen yesterday for a CPE and she now has a sinus infection. Can she have something sent in to [facility] in [place]  [phone]

## 2013-07-13 NOTE — Telephone Encounter (Signed)
Spoke to Dr. Elder Cyphers. He advised to order a Zpak.   Pt made aware.

## 2013-07-28 ENCOUNTER — Telehealth: Payer: Self-pay

## 2013-07-28 NOTE — Telephone Encounter (Signed)
Order # to cancel 04/18/13 Rx is #[id number].

## 2013-07-28 NOTE — Telephone Encounter (Signed)
[name] pharm called to get authorization to fill a written Rx for alprazolam mailed by pt that was written 04/18/13. We had called one in on 04/23/13. I advised another was written on 07/13/13 and they do not have a record of this one. I believe what happened is that pt was given the written Rx on 04/18/13 at [facility] and then when [employer] req'd a RF on 04/23/13, I called the one from 04/18/13 in thinking that they had not received it by fax on 04/18/13. Pt was probably also handed the written Rx from 07/13/13 and I did not want to OK the fill of earlier Rx if pt has the new one. I called pt and she advised that she did get both hard copies and sent in the oldest one. She will check to see if she has enough to last and call me in morn. If so she will send in the current Rx and I will call to cancel the older one at [facility].

## 2013-07-29 NOTE — Telephone Encounter (Signed)
Called pt back and she would rather send [place] back the hard copy of the Rx just written and have [place] call [employer] to authorize fill of the older Rx they have. She feels it will be faster that way and she has enough to last her a little while. Will await receipt of Rx from pt.

## 2013-08-02 NOTE — Telephone Encounter (Signed)
Ok to refill mine

## 2013-08-02 NOTE — Telephone Encounter (Signed)
[employer] called and reported that the pt is not able to locate the recent Rx written by Dr Elder Cyphers in order to send it back to [place]. Dr Laney Pastor, can I give [name] OK to fill the Rx for alprazolam written by you on 04/18/13 or do we need to wait for Dr Elder Cyphers? Pt only has a few more days left at home.

## 2013-08-02 NOTE — Telephone Encounter (Signed)
Trista's direct number is [phone] ext [phone] and the order # is [id number] to call her to advise whether we will approve Rx to be filled.

## 2013-08-03 NOTE — Telephone Encounter (Signed)
[place] and OKd fill of 04/18/13 Rx. Notified pt on VM

## 2013-08-10 ENCOUNTER — Telehealth: Payer: Self-pay | Admitting: Radiology

## 2013-08-10 NOTE — Telephone Encounter (Signed)
Patient mailed in a Rx for Alprazolam from 07/12/13, was written to your attention, is this to be shredded?

## 2013-08-11 NOTE — Telephone Encounter (Signed)
Yes, that was an extra Rx and is to be shredded. Thanks.

## 2013-08-12 NOTE — Telephone Encounter (Signed)
Thanks, I have shredded it.

## 2013-09-10 ENCOUNTER — Encounter: Payer: Managed Care, Other (non HMO) | Admitting: Gynecology

## 2013-09-21 ENCOUNTER — Telehealth: Payer: Self-pay | Admitting: Neurology

## 2013-09-21 NOTE — Telephone Encounter (Signed)
Pt called states she needs a new CPAC, pt states its broke, please call pt concerning this matter and let her know how she can get a new one soon. Pt is having trouble staying awake during the day.

## 2013-09-21 NOTE — Telephone Encounter (Signed)
I called and spoke with the patient and she stated her CPAP broke and she said her DME company [employer]) is not in [place] [employer] and she lives outside of [place]. I informed the patient that I will call a couple of DME company in [place] to see who accept [employer] and see if they can service her machine or do a replacement.

## 2013-09-22 ENCOUNTER — Other Ambulatory Visit: Payer: Self-pay | Admitting: *Deleted

## 2013-09-22 DIAGNOSIS — G4733 Obstructive sleep apnea (adult) (pediatric): Secondary | ICD-10-CM

## 2013-10-06 ENCOUNTER — Other Ambulatory Visit: Payer: Self-pay | Admitting: Neurology

## 2013-10-06 DIAGNOSIS — G4710 Hypersomnia, unspecified: Secondary | ICD-10-CM

## 2013-10-06 DIAGNOSIS — G4733 Obstructive sleep apnea (adult) (pediatric): Secondary | ICD-10-CM

## 2013-10-06 DIAGNOSIS — F901 Attention-deficit hyperactivity disorder, predominantly hyperactive type: Secondary | ICD-10-CM

## 2013-10-06 MED ORDER — MODAFINIL 200 MG PO TABS: 200.0000 mg | ORAL_TABLET | Freq: Every day | ORAL | Status: DC

## 2013-10-06 NOTE — Telephone Encounter (Signed)
Pt called states she can't get her modafinil (PROVIGIL) 200 MG tablet because it wasn't sent correctly. Pt states it needs to go to [facility] and would like for someone to call her when this has been called in. Thanks

## 2013-10-06 NOTE — Telephone Encounter (Signed)
This Rx was last written in Sept 2014.  We have not sent a refill since that time.

## 2013-10-07 LAB — IFOBT (OCCULT BLOOD): IMMUNOLOGICAL FECAL OCCULT BLOOD TEST: NEGATIVE

## 2013-10-07 NOTE — Telephone Encounter (Signed)
Rx signed and faxed.

## 2014-01-21 ENCOUNTER — Encounter: Payer: Self-pay | Admitting: Neurology

## 2014-01-21 ENCOUNTER — Encounter (INDEPENDENT_AMBULATORY_CARE_PROVIDER_SITE_OTHER): Payer: Self-pay

## 2014-01-21 ENCOUNTER — Ambulatory Visit (INDEPENDENT_AMBULATORY_CARE_PROVIDER_SITE_OTHER): Payer: Managed Care, Other (non HMO) | Admitting: Neurology

## 2014-01-21 VITALS — BP 132/86 | HR 76 | Resp 14 | Ht 65.0 in | Wt 221.0 lb

## 2014-01-21 DIAGNOSIS — F901 Attention-deficit hyperactivity disorder, predominantly hyperactive type: Secondary | ICD-10-CM

## 2014-01-21 DIAGNOSIS — G4710 Hypersomnia, unspecified: Secondary | ICD-10-CM

## 2014-01-21 DIAGNOSIS — F909 Attention-deficit hyperactivity disorder, unspecified type: Secondary | ICD-10-CM

## 2014-01-21 DIAGNOSIS — G4733 Obstructive sleep apnea (adult) (pediatric): Secondary | ICD-10-CM | POA: Insufficient documentation

## 2014-01-21 DIAGNOSIS — E669 Obesity, unspecified: Secondary | ICD-10-CM | POA: Insufficient documentation

## 2014-01-21 DIAGNOSIS — Z9989 Dependence on other enabling machines and devices: Secondary | ICD-10-CM

## 2014-01-21 MED ORDER — MODAFINIL 200 MG PO TABS: 200.0000 mg | ORAL_TABLET | Freq: Every day | ORAL | Status: AC

## 2014-01-21 NOTE — Patient Instructions (Signed)
Sleep Apnea  Sleep apnea is a sleep disorder characterized by abnormal pauses in breathing while you sleep. When your breathing pauses, the level of oxygen in your blood decreases. This causes you to move out of deep sleep and into light sleep. As a result, your quality of sleep is poor, and the system that carries your blood throughout your body (cardiovascular system) experiences stress. If sleep apnea remains untreated, the following conditions can develop:  High blood pressure (hypertension).  Coronary artery disease.  Inability to achieve or maintain an erection (impotence).  Impairment of your thought process (cognitive dysfunction). There are three types of sleep apnea: 1. Obstructive sleep apnea--Pauses in breathing during sleep because of a blocked airway. 2. Central sleep apnea--Pauses in breathing during sleep because the area of the brain that controls your breathing does not send the correct signals to the muscles that control breathing. 3. Mixed sleep apnea--A combination of both obstructive and central sleep apnea. RISK FACTORS The following risk factors can increase your risk of developing sleep apnea:  Being overweight.  Smoking.  Having narrow passages in your nose and throat.  Being of older age.  Being female.  Alcohol use.  Sedative and tranquilizer use.  Ethnicity. Among individuals younger than 35 years, African Americans are at increased risk of sleep apnea. SYMPTOMS   Difficulty staying asleep.  Daytime sleepiness and fatigue.  Loss of energy.  Irritability.  Loud, heavy snoring.  Morning headaches.  Trouble concentrating.  Forgetfulness.  Decreased interest in sex. DIAGNOSIS  In order to diagnose sleep apnea, your caregiver will perform a physical examination. Your caregiver may suggest that you take a home sleep test. Your caregiver may also recommend that you spend the night in a sleep lab. In the sleep lab, several monitors record  information about your heart, lungs, and brain while you sleep. Your leg and arm movements and blood oxygen level are also recorded. TREATMENT The following actions may help to resolve mild sleep apnea:  Sleeping on your side.   Using a decongestant if you have nasal congestion.   Avoiding the use of depressants, including alcohol, sedatives, and narcotics.   Losing weight and modifying your diet if you are overweight. There also are devices and treatments to help open your airway:  Oral appliances. These are custom-made mouthpieces that shift your lower jaw forward and slightly open your bite. This opens your airway.  Devices that create positive airway pressure. This positive pressure "splints" your airway open to help you breathe better during sleep. The following devices create positive airway pressure:  Continuous positive airway pressure (CPAP) device. The CPAP device creates a continuous level of air pressure with an air pump. The air is delivered to your airway through a mask while you sleep. This continuous pressure keeps your airway open.  Nasal expiratory positive airway pressure (EPAP) device. The EPAP device creates positive air pressure as you exhale. The device consists of single-use valves, which are inserted into each nostril and held in place by adhesive. The valves create very little resistance when you inhale but create much more resistance when you exhale. That increased resistance creates the positive airway pressure. This positive pressure while you exhale keeps your airway open, making it easier to breath when you inhale again.  Bilevel positive airway pressure (BPAP) device. The BPAP device is used mainly in patients with central sleep apnea. This device is similar to the CPAP device because it also uses an air pump to deliver continuous air pressure   through a mask. However, with the BPAP machine, the pressure is set at two different levels. The pressure when you  exhale is lower than the pressure when you inhale.  Surgery. Typically, surgery is only done if you cannot comply with less invasive treatments or if the less invasive treatments do not improve your condition. Surgery involves removing excess tissue in your airway to create a wider passage way. Document Released: 06/14/2002 Document Revised: 10/19/2012 Document Reviewed: 10/31/2011 Exit[employer] Patient Information 2015 [employer]. This information is not intended to replace advice given to you by your health care provider. Make sure you discuss any questions you have with your health care provider.  
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
## 2014-05-09 ENCOUNTER — Encounter: Payer: Self-pay | Admitting: Neurology

## 2015-01-24 ENCOUNTER — Ambulatory Visit: Payer: Managed Care, Other (non HMO) | Admitting: Neurology
